# Patient Record
Sex: Male | Born: 1965
Health system: Southern US, Community
[De-identification: ages and names within clinical notes are randomized; demographics above are authoritative.]

## PROBLEM LIST (undated history)

## (undated) DIAGNOSIS — B029 Zoster without complications: Secondary | ICD-10-CM

## (undated) DIAGNOSIS — Z8619 Personal history of other infectious and parasitic diseases: Secondary | ICD-10-CM

## (undated) HISTORY — PX: HERNIA REPAIR: SHX51

## (undated) HISTORY — PX: KNEE ARTHROSCOPY: SHX127

## (undated) HISTORY — DX: Personal history of other infectious and parasitic diseases: Z86.19

## (undated) HISTORY — PX: SHOULDER ARTHROSCOPY: SHX128

---

## 2004-04-21 ENCOUNTER — Ambulatory Visit: Payer: Self-pay | Admitting: Orthopaedic Surgery

## 2004-04-28 ENCOUNTER — Encounter: Payer: Self-pay | Admitting: Orthopaedic Surgery

## 2004-05-21 ENCOUNTER — Encounter: Payer: Self-pay | Admitting: Orthopaedic Surgery

## 2005-09-29 ENCOUNTER — Emergency Department: Payer: Self-pay | Admitting: Unknown Physician Specialty

## 2006-01-18 ENCOUNTER — Emergency Department (HOSPITAL_COMMUNITY): Admission: EM | Admit: 2006-01-18 | Discharge: 2006-01-18 | Payer: Self-pay | Admitting: Emergency Medicine

## 2006-03-02 ENCOUNTER — Observation Stay: Payer: Self-pay | Admitting: Internal Medicine

## 2006-03-02 ENCOUNTER — Other Ambulatory Visit: Payer: Self-pay

## 2006-05-18 ENCOUNTER — Ambulatory Visit: Payer: Self-pay | Admitting: Family Medicine

## 2006-08-22 ENCOUNTER — Emergency Department: Payer: Self-pay | Admitting: Emergency Medicine

## 2006-08-22 ENCOUNTER — Other Ambulatory Visit: Payer: Self-pay

## 2006-09-05 ENCOUNTER — Ambulatory Visit: Payer: Self-pay | Admitting: Gastroenterology

## 2006-09-06 ENCOUNTER — Ambulatory Visit: Payer: Self-pay | Admitting: Gastroenterology

## 2006-11-23 ENCOUNTER — Emergency Department: Payer: Self-pay | Admitting: Unknown Physician Specialty

## 2006-11-23 ENCOUNTER — Other Ambulatory Visit: Payer: Self-pay

## 2006-12-21 ENCOUNTER — Ambulatory Visit: Payer: Self-pay | Admitting: Unknown Physician Specialty

## 2006-12-22 ENCOUNTER — Ambulatory Visit: Payer: Self-pay | Admitting: Cardiology

## 2007-01-03 ENCOUNTER — Encounter: Payer: Self-pay | Admitting: Orthopaedic Surgery

## 2007-01-20 ENCOUNTER — Encounter: Payer: Self-pay | Admitting: Orthopaedic Surgery

## 2007-08-13 IMAGING — CR DG CHEST 1V PORT
1 series · 1 of 1 positions shown · non-contrast
Comparison: none

REASON FOR EXAM: Chest pain rm 3
COMMENTS:

PROCEDURE:     DXR - DXR PORTABLE CHEST SINGLE VIEW  - March 02, 2006  [DATE]
RESULT:     AP view the chest shows a lung fields to be clear. No acute
changes of the heart, mediastinal or osseous structures are seen.
Monitoring electrodes are present.

[view not recorded]
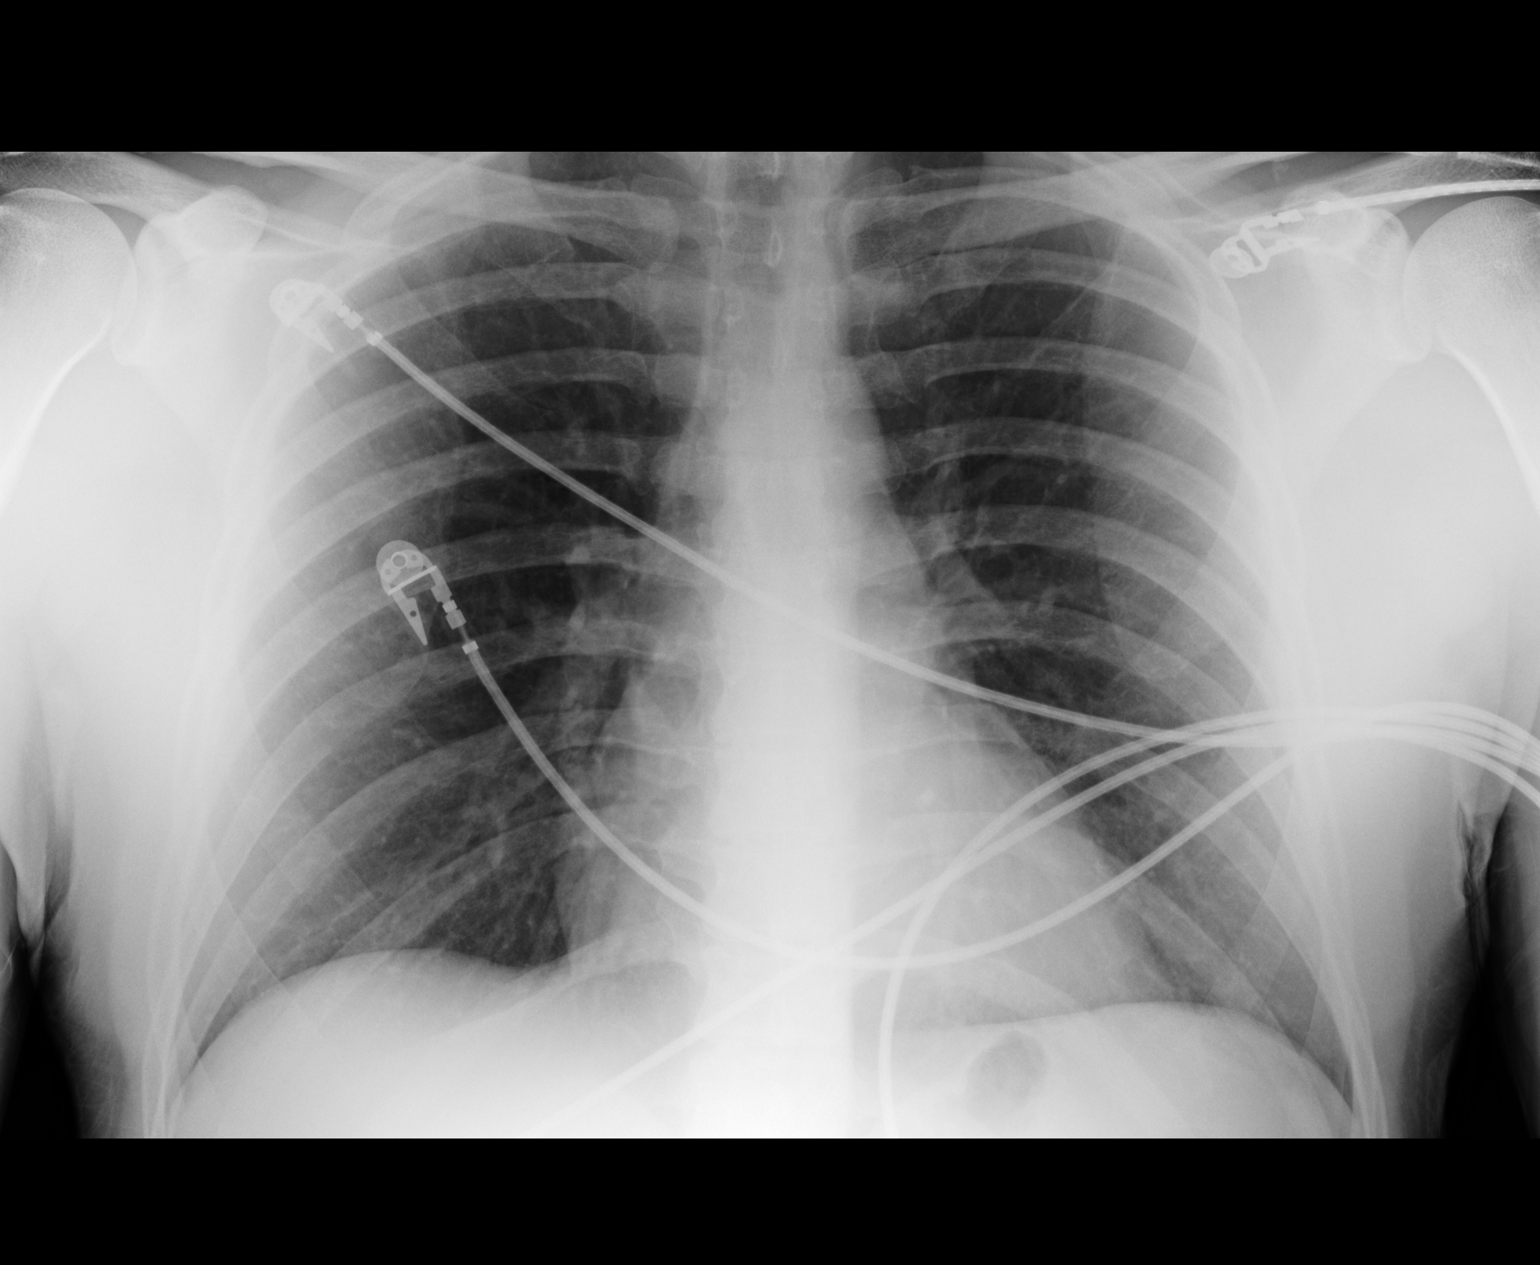

[1 of 1 positions shown; findings below may reference images not displayed]

IMPRESSION: 1.     No acute changes are identified.

## 2007-09-05 ENCOUNTER — Encounter: Payer: Self-pay | Admitting: Internal Medicine

## 2007-09-18 ENCOUNTER — Emergency Department: Payer: Self-pay | Admitting: Unknown Physician Specialty

## 2007-09-20 ENCOUNTER — Encounter: Payer: Self-pay | Admitting: Internal Medicine

## 2007-09-29 ENCOUNTER — Emergency Department: Payer: Self-pay | Admitting: Emergency Medicine

## 2007-10-20 ENCOUNTER — Encounter: Payer: Self-pay | Admitting: Internal Medicine

## 2007-11-20 ENCOUNTER — Encounter: Payer: Self-pay | Admitting: Internal Medicine

## 2007-12-20 ENCOUNTER — Encounter: Payer: Self-pay | Admitting: Internal Medicine

## 2008-02-02 IMAGING — CR DG CHEST 2V
1 series · 2 of 2 positions shown · non-contrast
Comparison: none

REASON FOR EXAM: cp
COMMENTS:

[Series 1: view not recorded · 0.17mm/px · 2 of 2 slices shown]
[im 1/2]
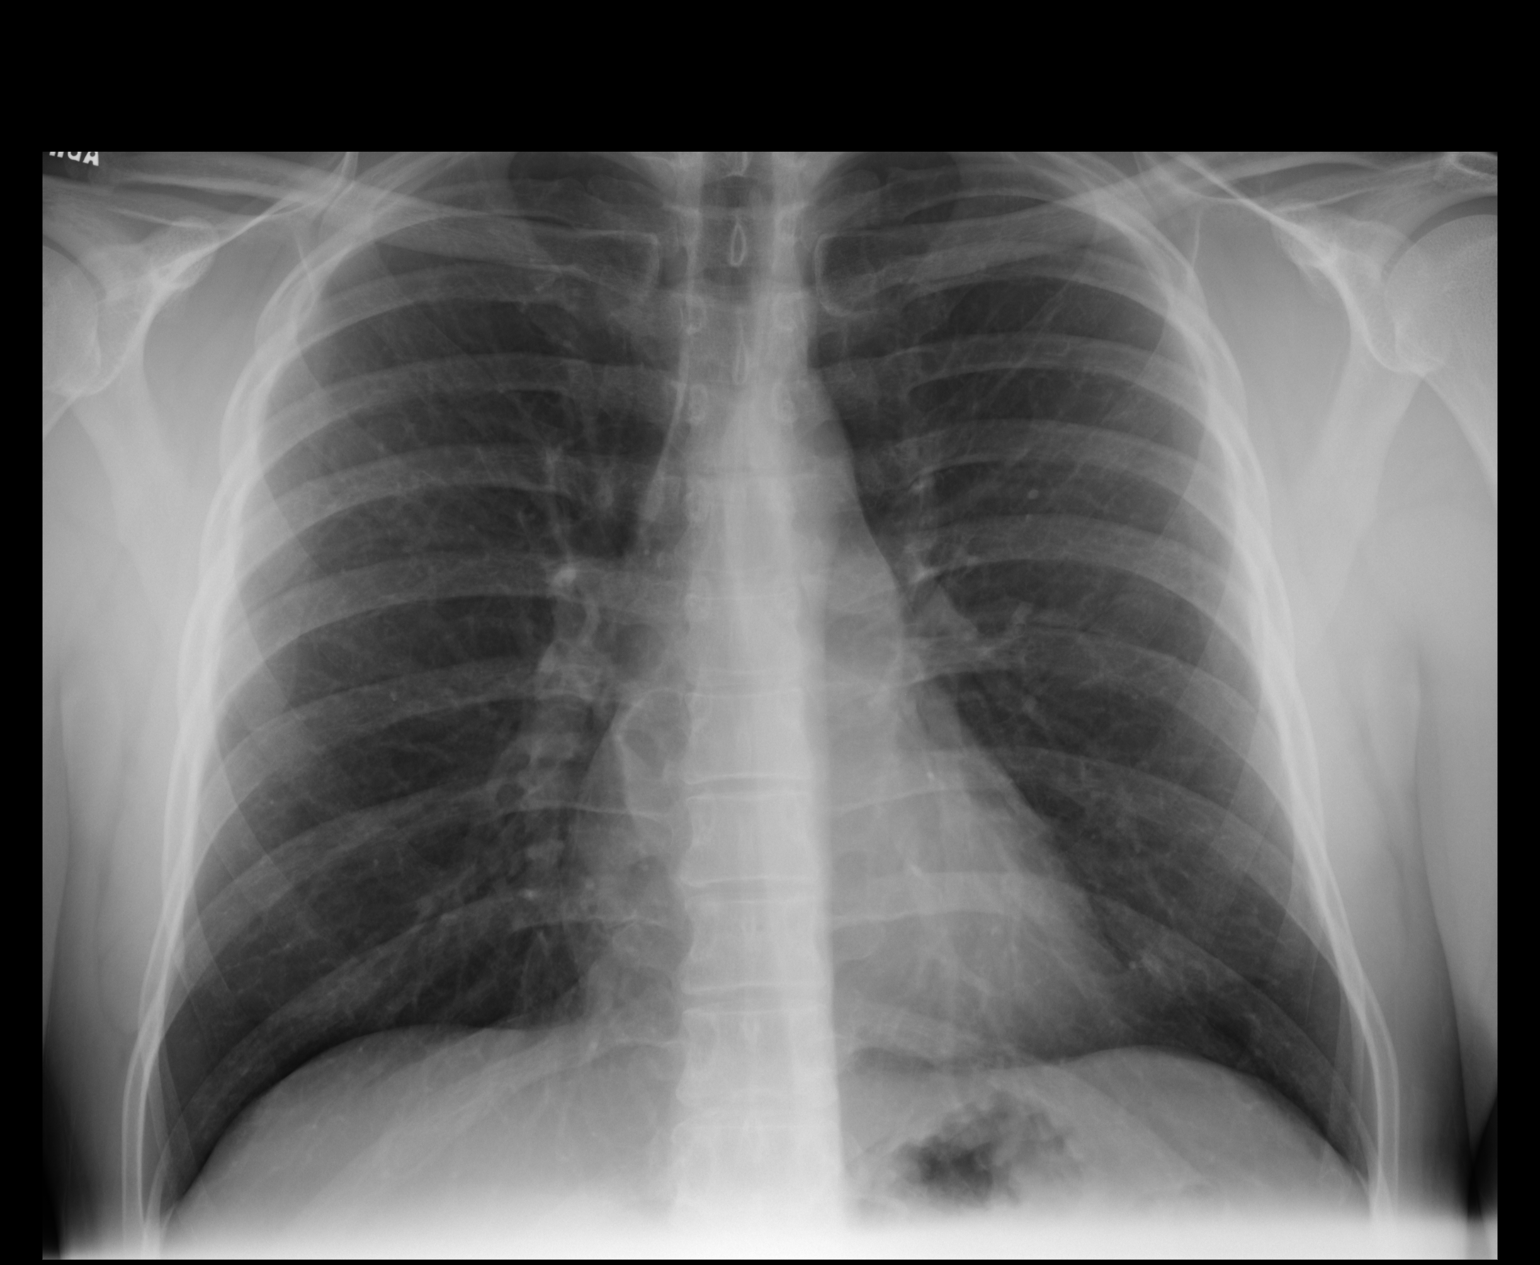
[im 2/2]
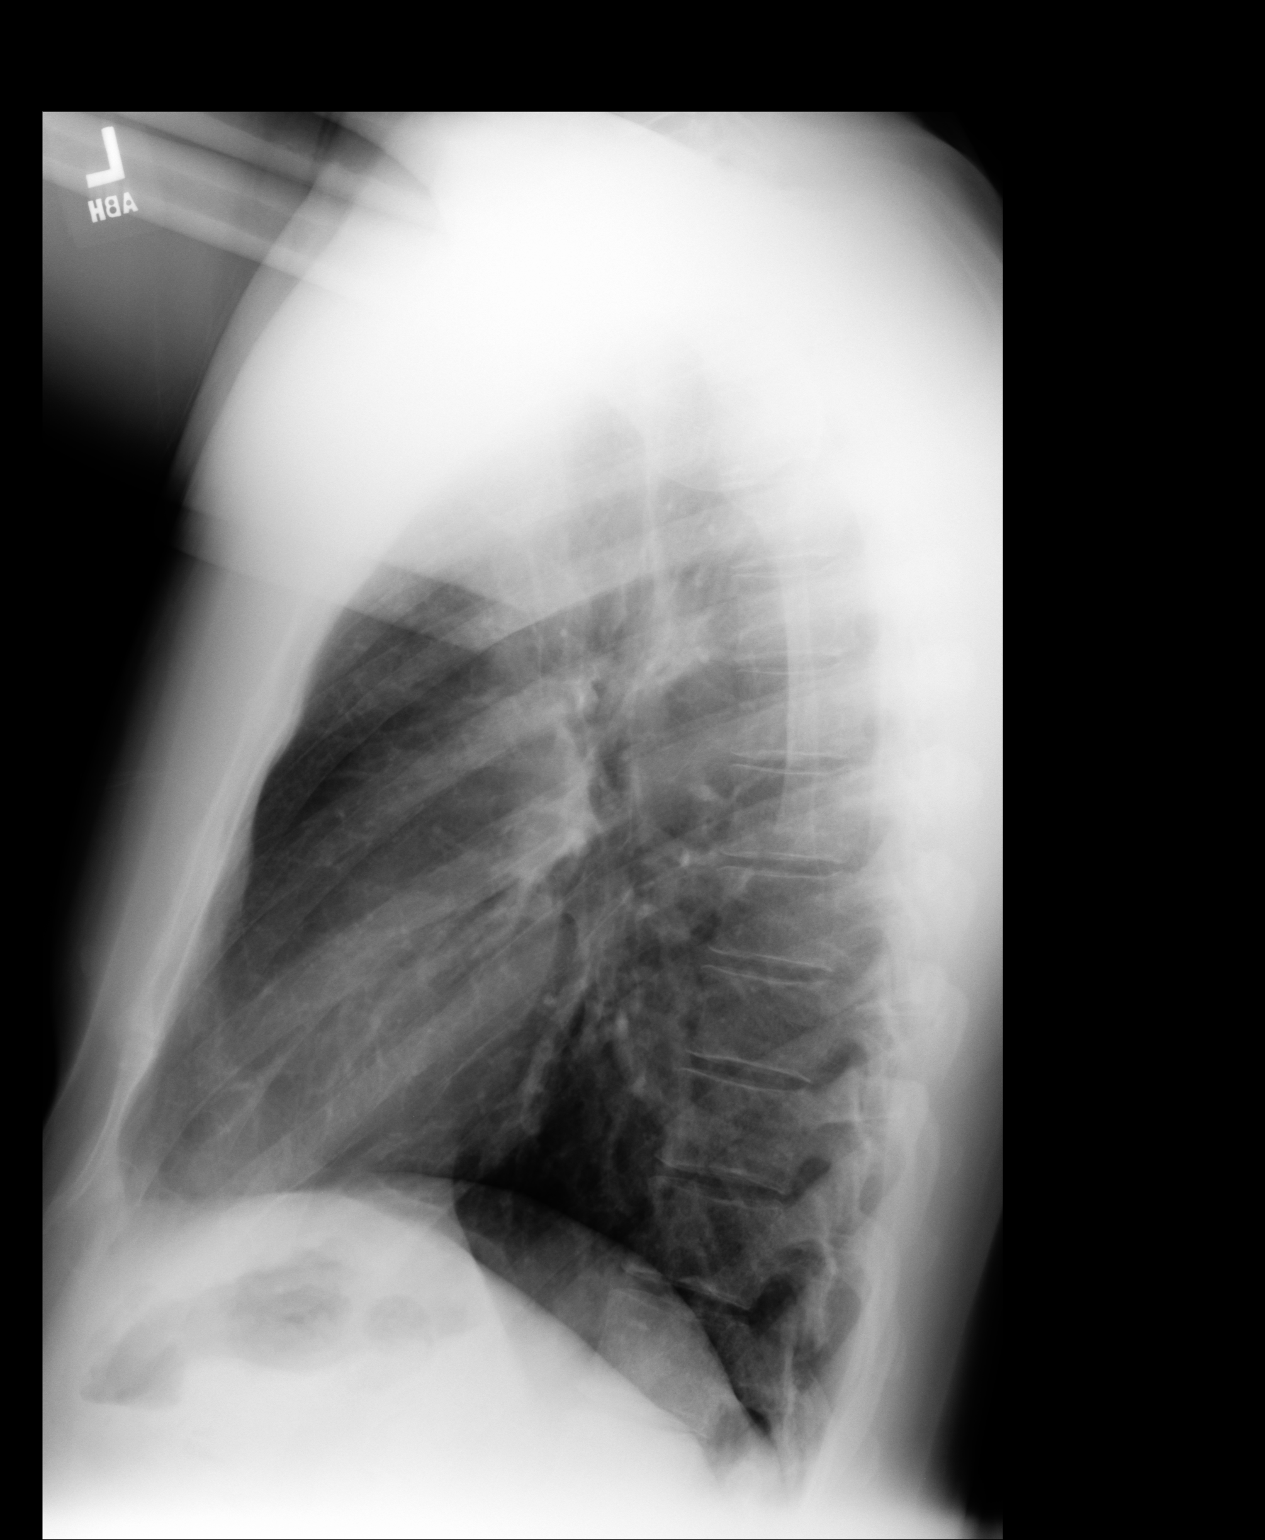

[2 of 2 positions shown; findings below may reference images not displayed]

PROCEDURE:     DXR - DXR CHEST PA (OR AP) AND LATERAL  - August 22, 2006  [DATE]

RESULT:     Comparison is made to the study dated 03/02/2006. The heart and
pulmonary vessels are normal. The lungs are clear. The bony and mediastinal
structures are unremarkable. There is mild hyperinflation. This can be seen
in reactive airway disease.
IMPRESSION: 1. Mild hyperinflation. Clinical correlation is recommended.
2. No acute cardiopulmonary disease.

## 2008-02-16 IMAGING — US ABDOMEN ULTRASOUND
1 series · 17 of 25 positions shown · non-contrast
Comparison: none

REASON FOR EXAM: epigastric pain  seen in ED/[HOSPITAL] 08-22-06
COMMENTS:

PROCEDURE:     US  - US ABDOMEN GENERAL SURVEY  - September 05, 2006  [DATE]
RESULT:     The liver, spleen, pancreas and abdominal aorta are normal
appearance. No gallstones are seen. There is no thickening of the
gallbladder wall. The kidneys show no hydronephrosis. There is no ascites.

[Series 1: abdomen ultrasound · 17 of 60 slices shown]
[im 1/60]
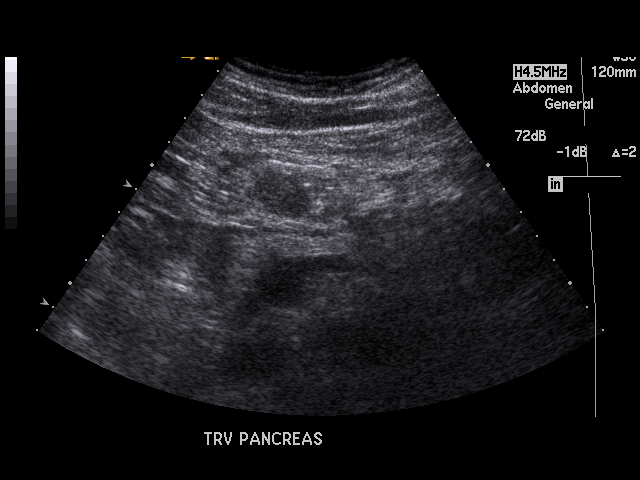
[im 5/60]
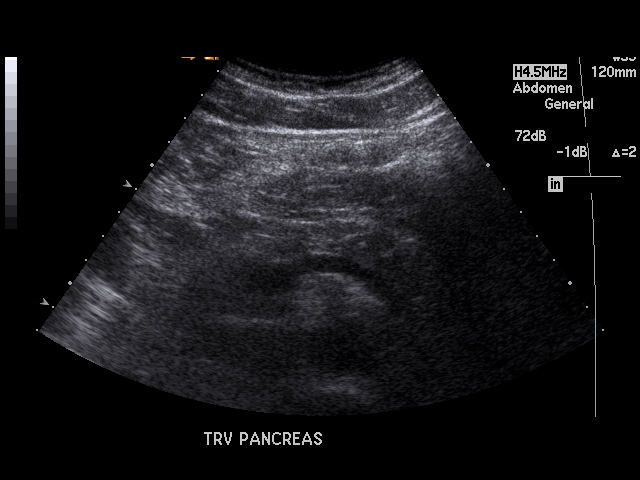
[im 8/60]
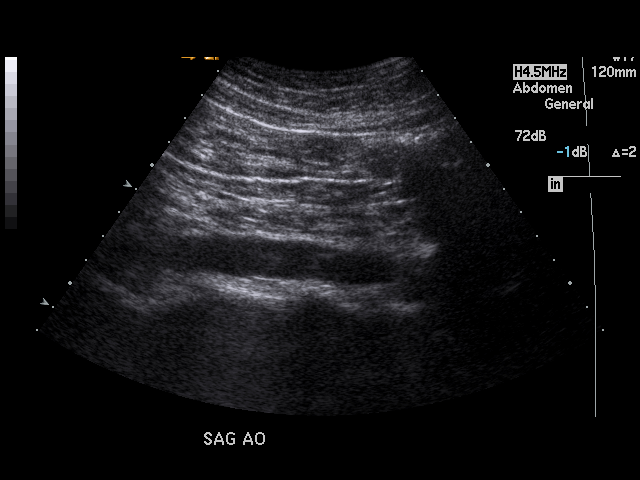
[im 13/60]
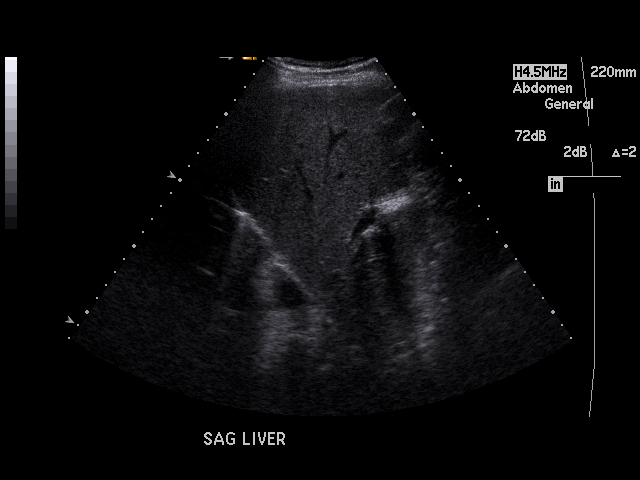
[im 15/60]
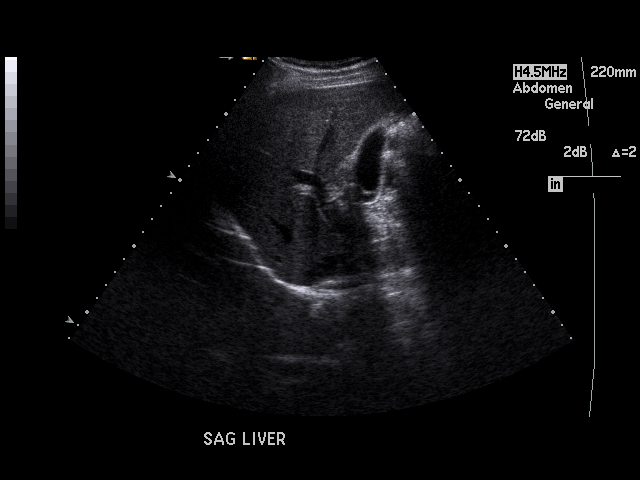
[im 20/60]
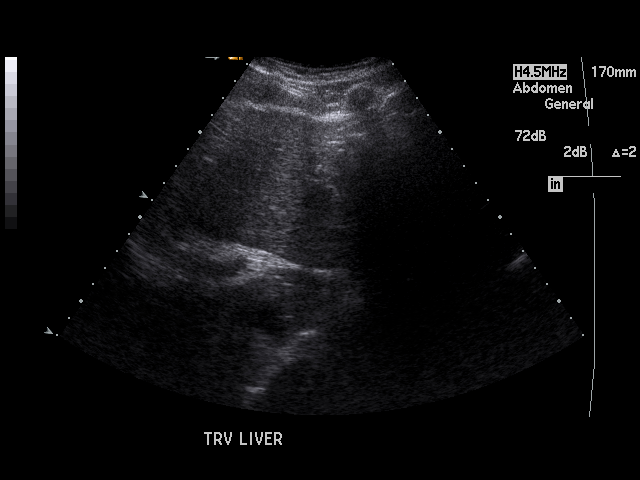
[im 23/60]
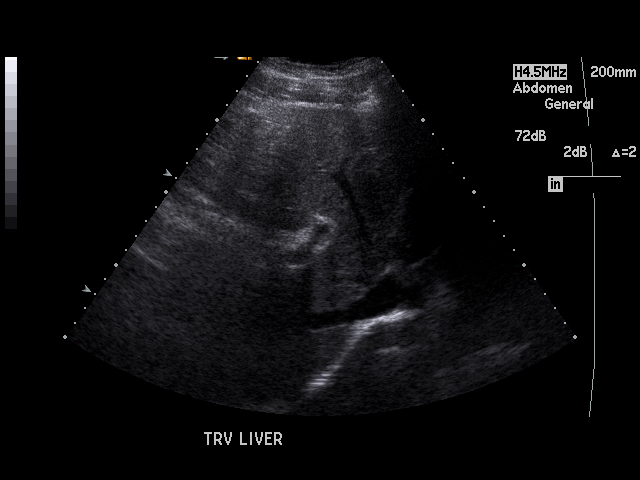
[im 28/60]
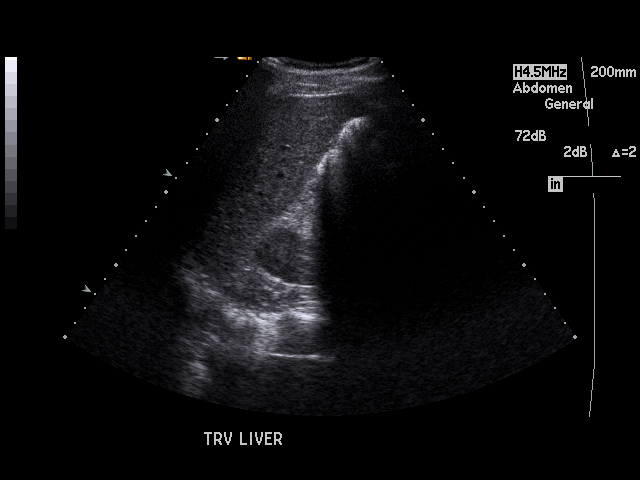
[im 30/60]
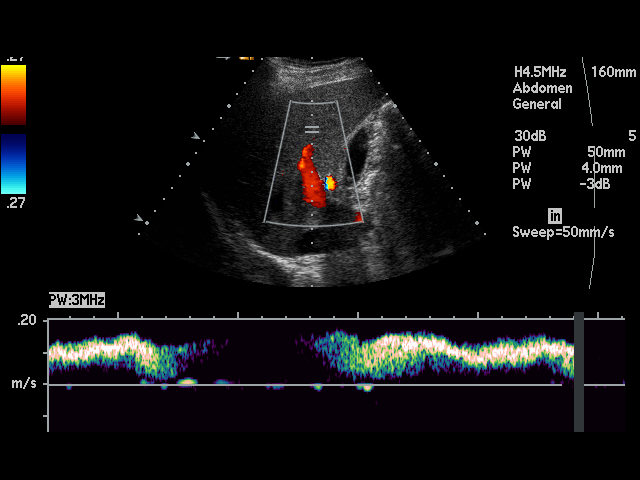
[im 32/60]
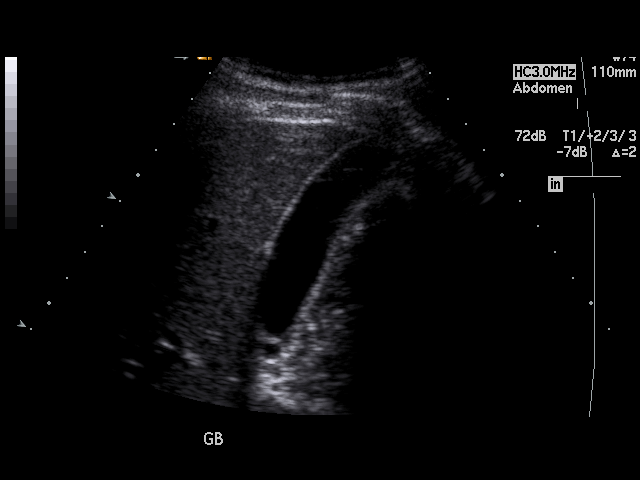
[im 37/60]
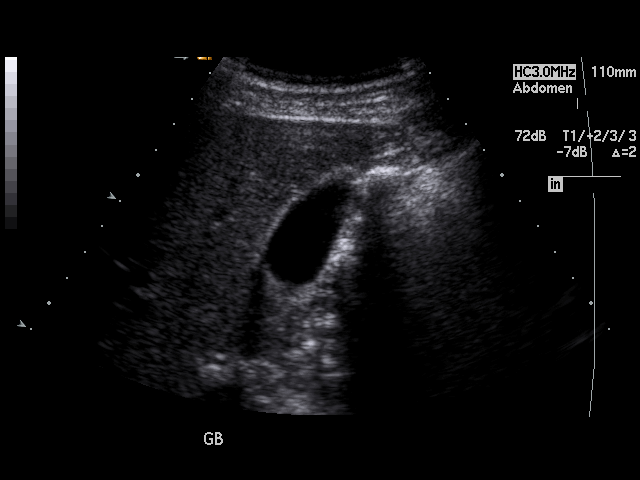
[im 40/60]
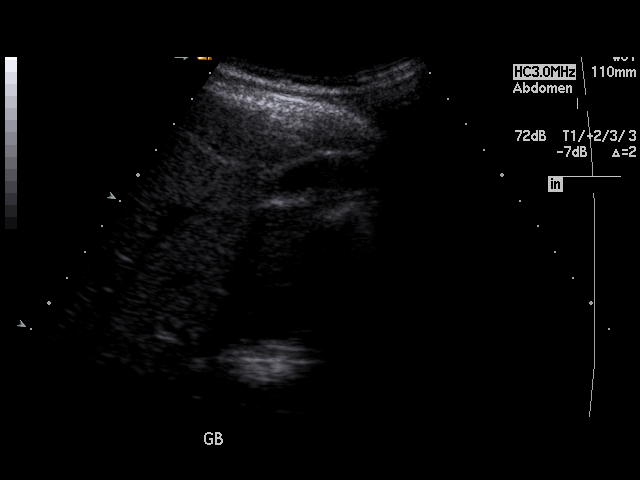
[im 45/60]
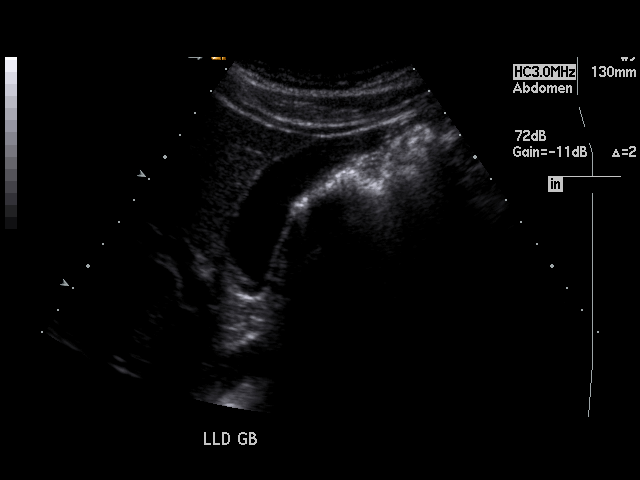
[im 47/60]
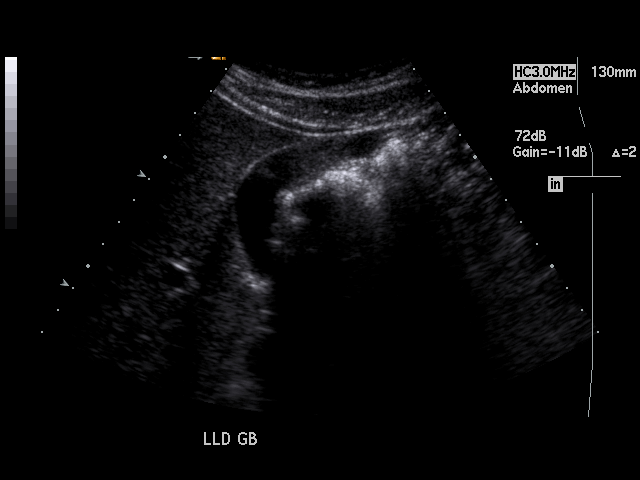
[im 52/60]
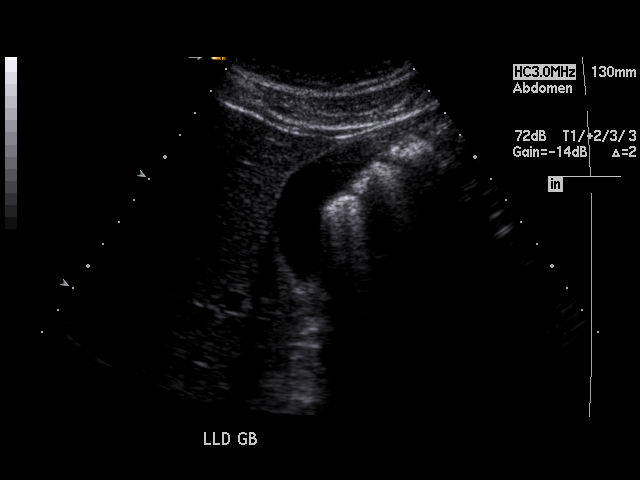
[im 55/60]
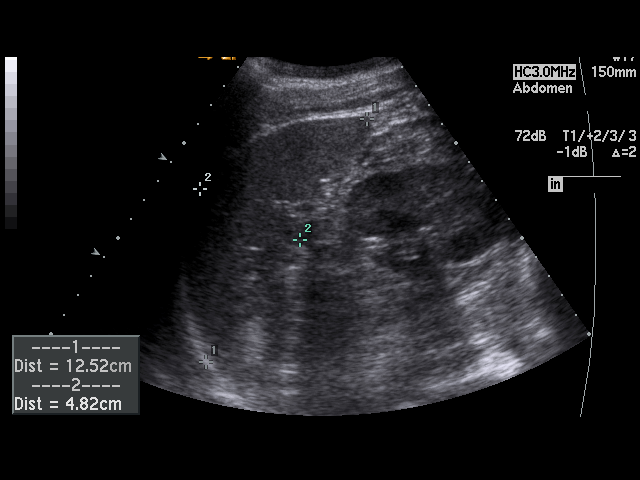
[im 60/60]
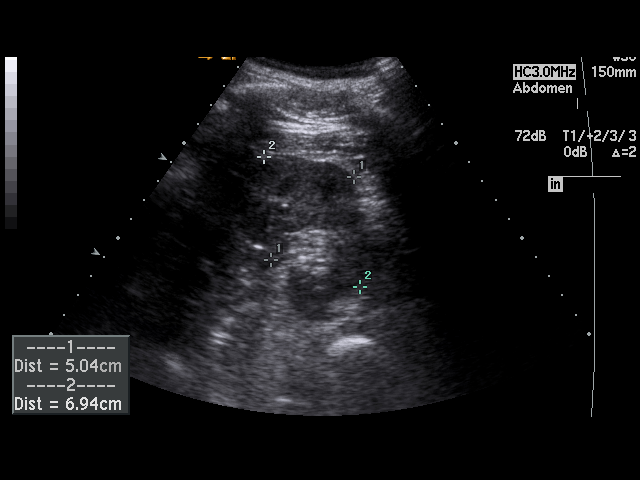

[17 of 25 positions shown; findings below may reference images not displayed]

IMPRESSION: 1. No significant abnormalities are noted.
2. No gallstones are seen.
3. The common bile duct measures 3.2 mm in diameter which is within normal
limits.

## 2009-02-28 IMAGING — CT CT ABD-PELV W/O CM
1 of 2 series · 15 of 32 positions shown, 19 images · non-contrast
Comparison: none

REASON FOR EXAM: (1) left llq pain; (2) llq pain
COMMENTS:

[Series 2: stone · axial · 0.68mm/px · z∈[-128,+268]mm · 15 of 149 slices shown, 19 images]
[im 11/149  soft-tissue]
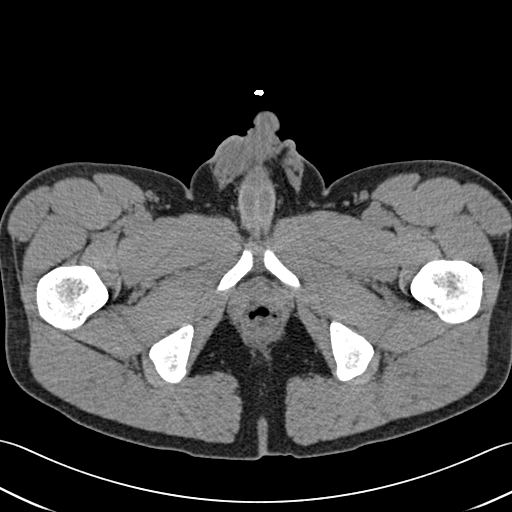
[im 11/149  bone]
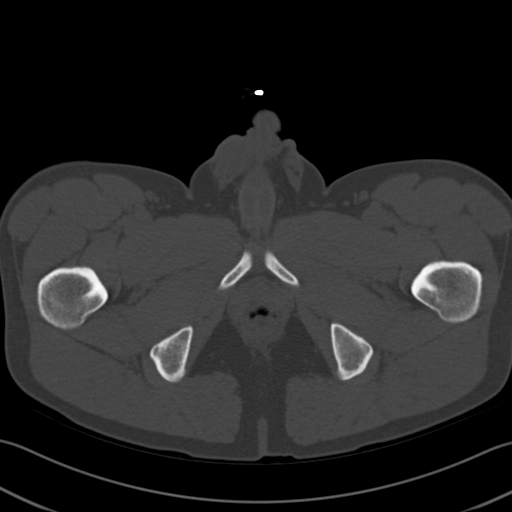
[im 22/149  soft-tissue]
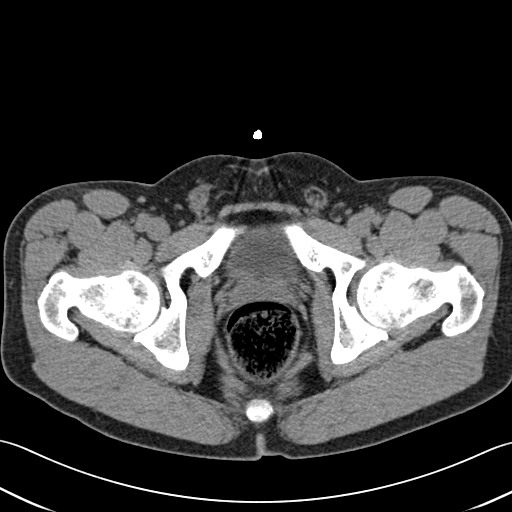
[im 32/149  soft-tissue]
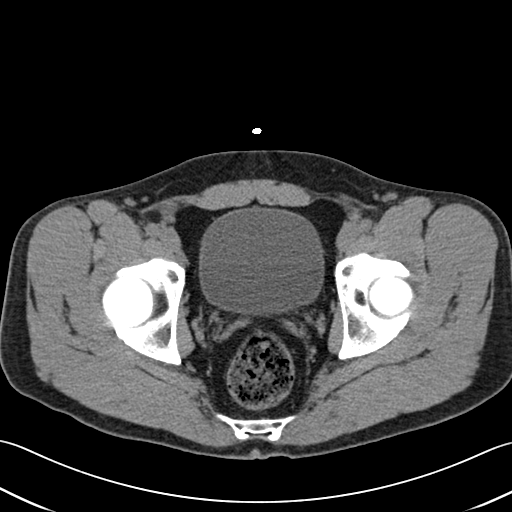
[im 43/149  soft-tissue]
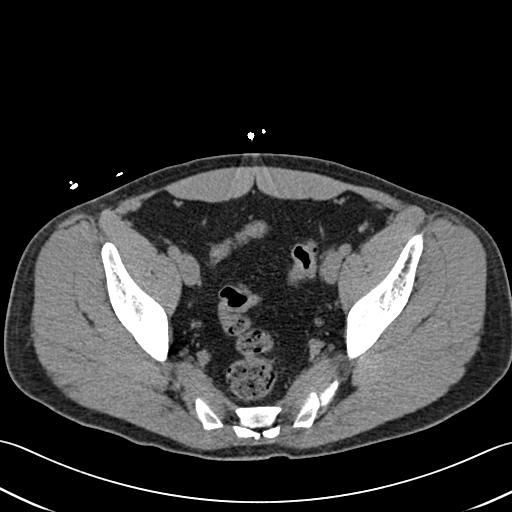
[im 53/149  soft-tissue]
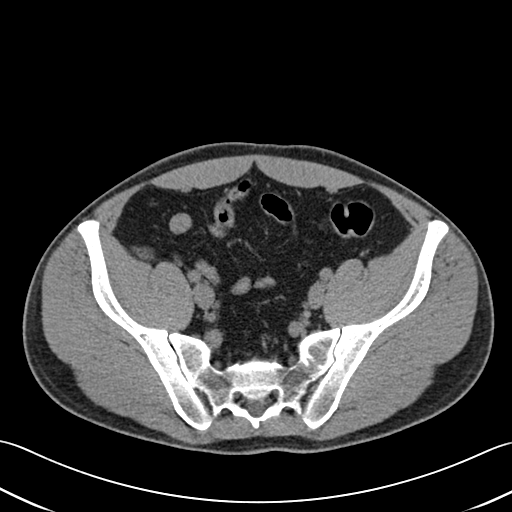
[im 64/149  soft-tissue]
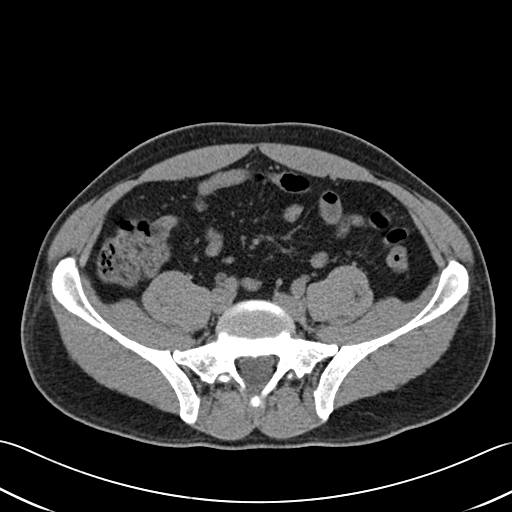
[im 75/149  soft-tissue]
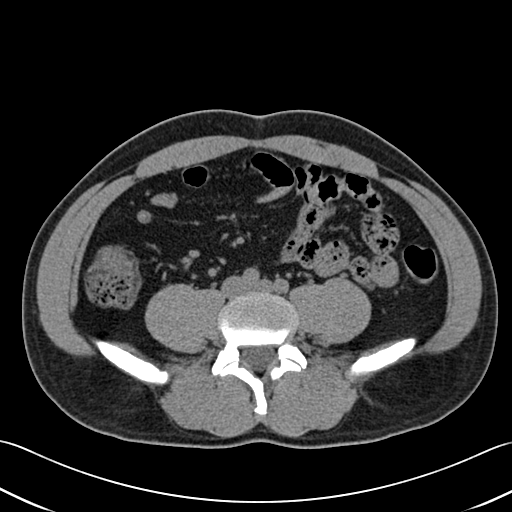
[im 85/149  soft-tissue]
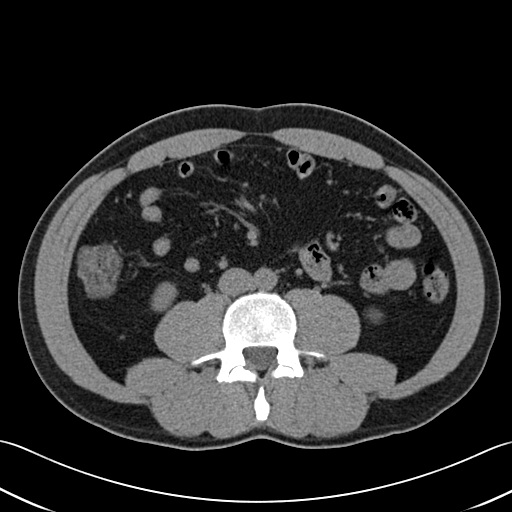
[im 96/149  soft-tissue]
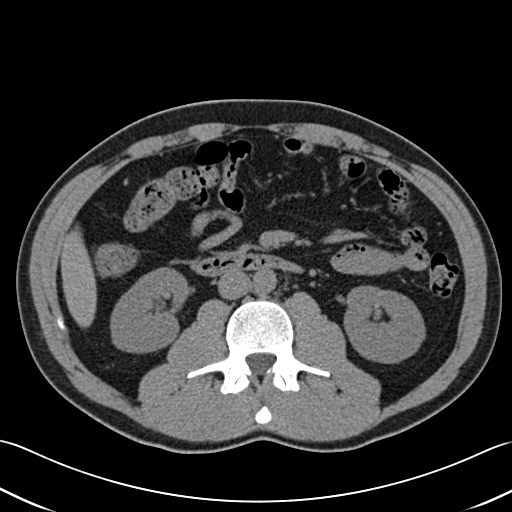
[im 96/149  bone]
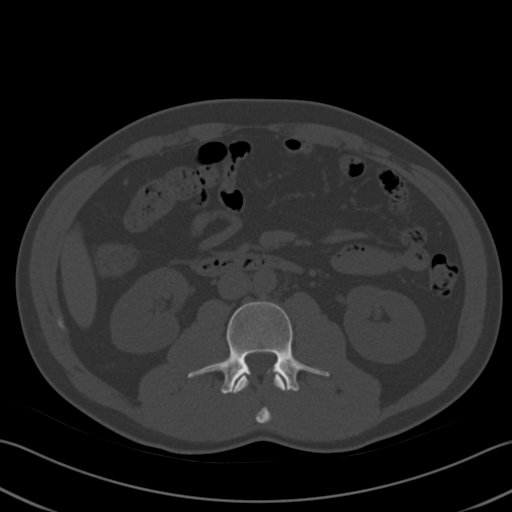
[im 106/149  soft-tissue]
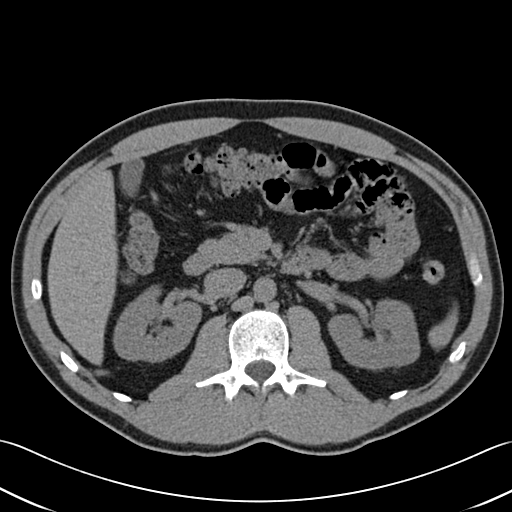
[im 117/149  soft-tissue]
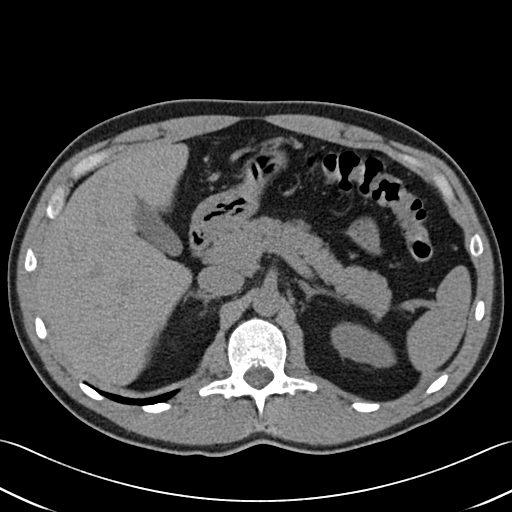
[im 127/149  soft-tissue]
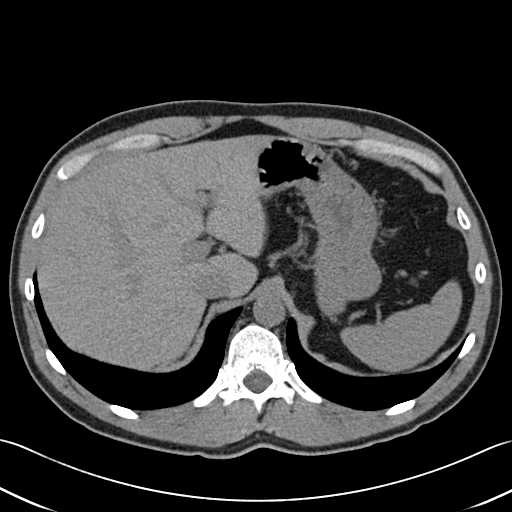
[im 127/149  lung]
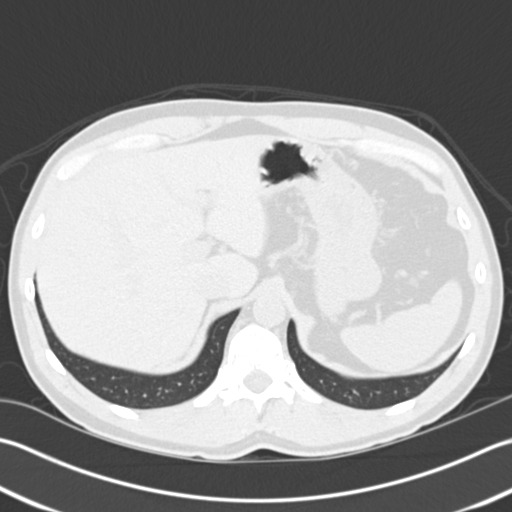
[im 133/149  lung]
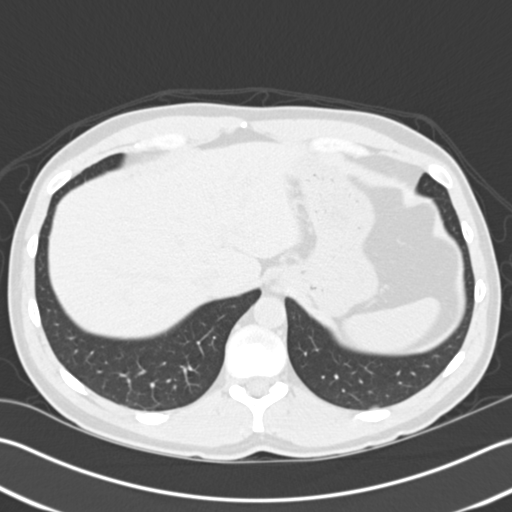
[im 138/149  soft-tissue]
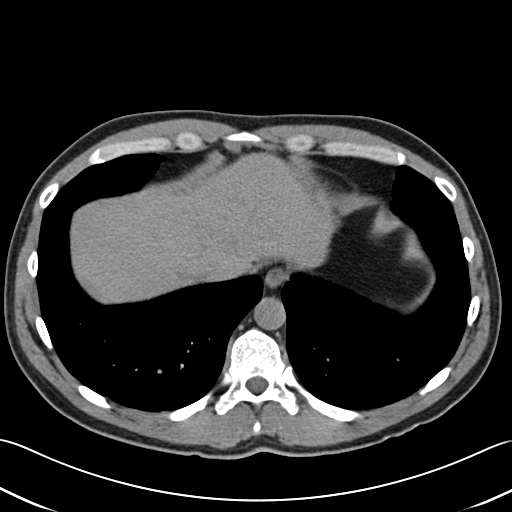
[im 138/149  lung]
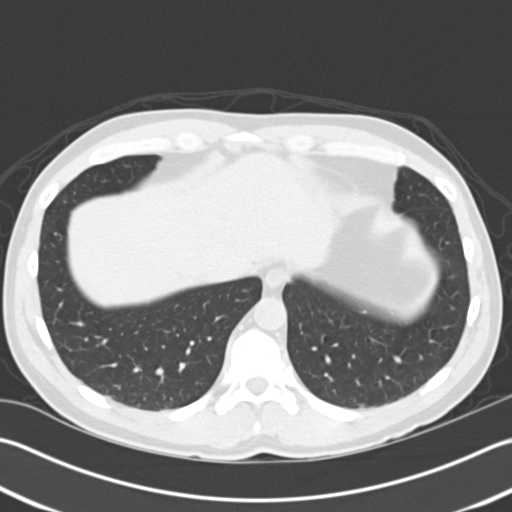
[im 143/149  lung]
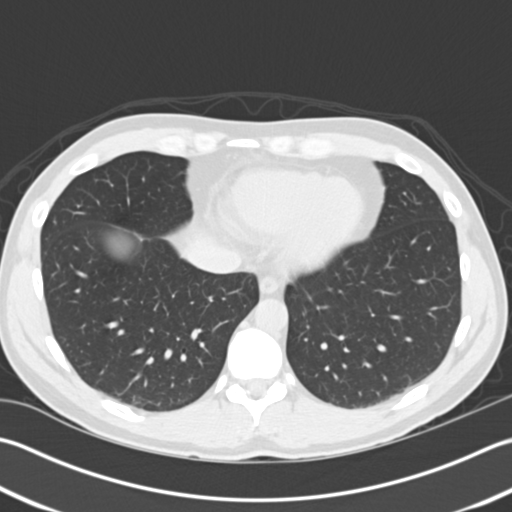

[15 of 32 positions shown; findings below may reference images not displayed]

PROCEDURE:     CT  - CT ABDOMEN AND PELVIS W[DATE] [DATE]

RESULT:     The study was performed without IV contrast. The patient is
complaining of LEFT lower quadrant discomfort.

The kidneys exhibit normal density and contour. There is no evidence of
obstruction. No perinephric edema is identified. The ureters are normal in
course and caliber. The partially distended urinary bladder is normal in
appearance. There is a phlebolith in the LEFT aspect of the pelvis. The
prostate gland is normal in appearance. The sigmoid colon and rectum are
mildly distended with stool and gas. I do not see objective evidence of
acute diverticulitis. No inguinal hernia is identified. There is no evidence
of ascites.

Elsewhere, the unopacified loops of small and large bowel are normal in
appearance. The liver, gallbladder, pancreas, nondistended stomach, spleen,
and adrenal glands are normal in appearance. There is a small accessory
spleen measuring approximately 12 mm in diameter. The caliber of the
abdominal aorta is normal. There is no evidence of a periumbilical hernia.
The lumbar vertebral bodies are preserved in height. The lung bases are
clear.
IMPRESSION: 1. I see no evidence of urinary tract obstruction or inflammation or stones.
2. There is no evidence of acute bowel abnormality. The sigmoid colon
proximally is incompletely distended and there are diverticula present, but
I do not see pericolonic inflammatory change.
3. I see no acute abnormality elsewhere within the abdomen or pelvis on this
noncontrast study. Follow-up contrast-enhanced scanning is available upon
request.

This report was called to the [HOSPITAL] the conclusion of the
study.

## 2010-07-20 ENCOUNTER — Ambulatory Visit
Admission: RE | Admit: 2010-07-20 | Discharge: 2010-07-20 | Payer: Self-pay | Source: Home / Self Care | Attending: Internal Medicine | Admitting: Internal Medicine

## 2012-01-06 ENCOUNTER — Encounter: Payer: Self-pay | Admitting: Internal Medicine

## 2012-01-06 ENCOUNTER — Ambulatory Visit (INDEPENDENT_AMBULATORY_CARE_PROVIDER_SITE_OTHER): Payer: 59 | Admitting: Internal Medicine

## 2012-01-06 VITALS — BP 110/70 | HR 77 | Temp 98.7°F | Ht 69.0 in | Wt 178.0 lb

## 2012-01-06 DIAGNOSIS — E161 Other hypoglycemia: Secondary | ICD-10-CM

## 2012-01-06 DIAGNOSIS — E162 Hypoglycemia, unspecified: Secondary | ICD-10-CM

## 2012-01-06 DIAGNOSIS — T6701XA Heatstroke and sunstroke, initial encounter: Secondary | ICD-10-CM

## 2012-01-06 LAB — CK: Total CK: 200 U/L (ref 7–232)

## 2012-01-06 LAB — GLUCOSE, POCT (MANUAL RESULT ENTRY): POC Glucose: 93 mg/dl (ref 70–99)

## 2012-01-06 NOTE — Patient Instructions (Addendum)
Your symptoms are all suggestive of heat exhaustion/heat stroke, but I am also ruling out diabetes and  Thyroid problems .  I advise avoiding running when the ambient temperature is > 85 and to always use an electrolyte replacement fluid that contains sugar  .  Remember to combine protein with any meal or snack    I advise your  using OTC prilosec or prevacid (omeprazole 20 mg daily in the morning  ) while using daily Alleve, to protect your stomach from gastritis

## 2012-01-06 NOTE — Progress Notes (Signed)
Patient ID: Jeffery Mcmillan, male   DOB: 09/06/1965, 46 y.o.   MRN: [id number]  Patient Active Problem List  Diagnosis  . Reactive hypoglycemia    Subjective:  CC:   Chief Complaint  Patient presents with  . Follow-up    HPI:   Jeffery Mcmillan a 46 y.o. male who presents  Past Medical History  Diagnosis Date  . History of chicken pox     History reviewed. No pertinent past surgical history.       The following portions of the patient's history were reviewed and updated as appropriate: Allergies, current medications, and problem list.    Review of Systems:   12 Pt  review of systems was negative except those addressed in the HPI,     History   Social History  . Marital Status: Married    Spouse Name: N/A    Number of Children: N/A  . Years of Education: 16   Occupational History  . Police Officer [employer]   Social History Main Topics  . Smoking status: Never Smoker   . Smokeless tobacco: Never Used  . Alcohol Use: No  . Drug Use: Yes  . Sexually Active: Not on file   Other Topics Concern  . Not on file   Social History Narrative   Regular exercise-yesCaffeine Use-yes    Objective:  BP 110/70  Pulse 77  Temp 98.7 F (37.1 C) (Oral)  Ht 5\' 9"  (1.753 m)  Wt 178 lb (80.74 kg)  BMI 26.29 kg/m2  SpO2 98%  General appearance: alert, cooperative and appears stated age Neck: no adenopathy, no carotid bruit, supple, symmetrical, trachea midline and thyroid not enlarged, symmetric, no tenderness/mass/nodules Back: symmetric, no curvature. ROM normal. No CVA tenderness. Lungs: clear to auscultation bilaterally Heart: regular rate and rhythm, S1, S2 normal, no murmur, click, rub or gallop Abdomen: soft, non-tender; bowel sounds normal; no masses,  no organomegaly Pulses: 2+ and symmetric Skin: Skin color, texture, turgor normal. No rashes or lesions Lymph nodes: Cervical, supraclavicular, and axillary nodes normal. MSK: normal muscle  tone, full ROM, no wasting NEURO: grossly nonfocal  Assessment and Plan:  Reactive hypoglycemia His recent episodes have occurred in the setting of extreme heat exhaustion and is electively. He does have a family history of diabetes but no personal history. Random glucose today isn't within normal limits but his hemoglobin A1c is 6.0 suggesting that he may have adult onset diet controlled diabetes. Have recommended that he modify his diet to increase the protein in limit the complex carbohydrates and reduce his workouts will considerably. He has been using water to hydrate during his workouts and sewed it electrolyte drink which I recommended that he change to. He continues to have episodes of hypoglycemia we will refer her to Dr. Zachery Dakins endocrinology.   Updated Medication List No outpatient encounter prescriptions on file as of 01/06/2012.     Orders Placed This Encounter  Procedures  . COMPLETE METABOLIC PANEL WITH GFR  . TSH  . CK  . Hemoglobin A1c  . Magnesium  . POCT glucose (manual entry)    No Follow-up on file.

## 2012-01-07 ENCOUNTER — Telehealth: Payer: Self-pay | Admitting: *Deleted

## 2012-01-07 ENCOUNTER — Telehealth: Payer: Self-pay | Admitting: Internal Medicine

## 2012-01-07 LAB — COMPLETE METABOLIC PANEL WITH GFR
ALT: 18 U/L (ref 0–53)
AST: 22 U/L (ref 0–37)
Calcium: 9.3 mg/dL (ref 8.4–10.5)
Chloride: 103 mEq/L (ref 96–112)
GFR, Est Non African American: 87 mL/min
Total Bilirubin: 0.5 mg/dL (ref 0.3–1.2)

## 2012-01-07 NOTE — Telephone Encounter (Signed)
Pt called requesting results of lab work from yesterday-he was having an issue with MyChart sign up.

## 2012-01-07 NOTE — Telephone Encounter (Signed)
All of his labs were normal, but his hgba1c was not as low as I would expect

## 2012-01-08 ENCOUNTER — Encounter: Payer: Self-pay | Admitting: Internal Medicine

## 2012-01-08 DIAGNOSIS — R7303 Prediabetes: Secondary | ICD-10-CM | POA: Insufficient documentation

## 2012-01-08 NOTE — Assessment & Plan Note (Signed)
His recent episodes have occurred in the setting of extreme heat exhaustion and is electively. He does have a family history of diabetes but no personal history. Random glucose today isn't within normal limits but his hemoglobin A1c is 6.0 suggesting that he may have adult onset diet controlled diabetes. Have recommended that he modify his diet to increase the protein in limit the complex carbohydrates and reduce his workouts will considerably. He has been using water to hydrate during his workouts and sewed it electrolyte drink which I recommended that he change to. He continues to have episodes of hypoglycemia we will refer her to Dr. Zachery Dakins endocrinology.

## 2012-01-10 ENCOUNTER — Telehealth: Payer: Self-pay | Admitting: Internal Medicine

## 2012-01-10 NOTE — Telephone Encounter (Signed)
Patient notified

## 2012-01-10 NOTE — Telephone Encounter (Signed)
Patient notified of results.

## 2012-01-10 NOTE — Telephone Encounter (Signed)
Patient has been notified

## 2012-01-10 NOTE — Telephone Encounter (Signed)
Please call patient with his results the physician tried to speak with him on Friday and his phone cut off.

## 2012-06-02 ENCOUNTER — Encounter: Payer: Self-pay | Admitting: Adult Health

## 2012-06-02 ENCOUNTER — Ambulatory Visit (INDEPENDENT_AMBULATORY_CARE_PROVIDER_SITE_OTHER): Payer: 59 | Admitting: Adult Health

## 2012-06-02 VITALS — BP 139/89 | HR 76 | Temp 98.7°F | Wt 181.0 lb

## 2012-06-02 DIAGNOSIS — J019 Acute sinusitis, unspecified: Secondary | ICD-10-CM

## 2012-06-02 DIAGNOSIS — Z23 Encounter for immunization: Secondary | ICD-10-CM

## 2012-06-02 MED ORDER — AZITHROMYCIN 250 MG PO TABS: ORAL_TABLET | ORAL | Status: DC

## 2012-06-02 MED ORDER — FLUTICASONE PROPIONATE 50 MCG/ACT NA SUSP: 2.0000 | Freq: Every day | NASAL | Status: DC

## 2012-06-02 NOTE — Patient Instructions (Addendum)
  Please call if you do not feel any improvement within the next 2-3 days.  We will give you your flu shot today before you leave.  Start your antibiotic today.

## 2012-06-02 NOTE — Assessment & Plan Note (Signed)
Symptoms unresolved with OTC medications since 05/21/12. Will start Azithromycin and Flonase.

## 2012-06-02 NOTE — Progress Notes (Signed)
  Subjective:    Patient ID: DE JAWORSKI, male    DOB: [birth year], 46 y.o.   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 The sore throat has subsided but he reports congestion in his head and now in his chest. Positive for productive coughing, fullness in ears. Denies fever, sob, chest pain. Pt  has tried OTC products (tylenol cold/flu) but "just can't seem to get rid of this". He has not had flu vaccine this year. We will give this to him today.  Review of Systems  Constitutional: Negative for fever, chills and appetite change.  HENT: Positive for congestion, rhinorrhea and postnasal drip.        Ear fullness and "popping"  Respiratory: Positive for cough and wheezing. Negative for shortness of breath.   Cardiovascular: Negative for chest pain.  Gastrointestinal: Negative for nausea, vomiting and diarrhea.  Genitourinary: Negative for dysuria.  Musculoskeletal: Negative.   Skin: Negative for rash.  Neurological: Negative for dizziness, weakness, light-headedness and headaches.  Psychiatric/Behavioral: Negative.        Objective:   Physical Exam  Constitutional: He is oriented to person, place, and time. He appears well-developed and well-nourished. No distress.  HENT:  Head: Normocephalic.       Right ear with mild bulging. Pharynx with cobblestone appearance. Nasal mucosa erythematous.  Eyes: Right eye exhibits no discharge. Left eye exhibits discharge. No scleral icterus.  Neck: Normal range of motion. No tracheal deviation present.  Cardiovascular: Normal rate, regular rhythm and normal heart sounds.   No murmur heard. Pulmonary/Chest: Effort normal and breath sounds normal. No respiratory distress. He has no wheezes. He has no rales.  Abdominal: Bowel sounds are normal.  Musculoskeletal: Normal range of motion.  Lymphadenopathy:    He has no cervical adenopathy.  Neurological: He is  alert and oriented to person, place, and time.  Skin: Skin is warm and dry. No rash noted.  Psychiatric: He has a normal mood and affect. His behavior is normal. Judgment and thought content normal.          Assessment & Plan:

## 2012-06-09 ENCOUNTER — Encounter: Payer: Self-pay | Admitting: Adult Health

## 2012-06-09 ENCOUNTER — Ambulatory Visit (INDEPENDENT_AMBULATORY_CARE_PROVIDER_SITE_OTHER): Payer: 59 | Admitting: Adult Health

## 2012-06-09 VITALS — BP 130/84 | HR 85 | Temp 98.2°F | Ht 70.0 in | Wt 179.0 lb

## 2012-06-09 DIAGNOSIS — J329 Chronic sinusitis, unspecified: Secondary | ICD-10-CM

## 2012-06-09 MED ORDER — LEVOFLOXACIN 500 MG PO TABS: 500.0000 mg | ORAL_TABLET | Freq: Every day | ORAL | Status: DC

## 2012-06-09 MED ORDER — PREDNISONE 10 MG PO TABS: ORAL_TABLET | ORAL | Status: DC

## 2012-06-09 NOTE — Progress Notes (Signed)
  Subjective:    Patient ID: Jeffery Mcmillan, male    DOB: 1965/07/28, 46 y.o.   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 He is s/p azithromycin and presents to clinic today with report that symptoms appear to be returning. He is coughing up clear sputum, feels sinus pressure, occasional HA. He reports that he tried the flonase that was prescribed but only used it once or twice.   Current Outpatient Prescriptions on File Prior to Visit  Medication Sig Dispense Refill  . fluticasone (FLONASE) 50 MCG/ACT nasal spray Place 2 sprays into the nose daily.  16 g  6     Review of Systems  Constitutional: Negative for fever, chills and fatigue.  HENT: Positive for congestion, rhinorrhea and postnasal drip.   Eyes: Negative.   Respiratory: Positive for cough. Negative for shortness of breath and wheezing.   Cardiovascular: Negative.   Gastrointestinal: Negative.   Genitourinary: Negative.   Neurological: Negative for dizziness and weakness.  Psychiatric/Behavioral: Negative for confusion and agitation. The patient is not nervous/anxious.          Objective:   Physical Exam  Constitutional: He is oriented to person, place, and time. He appears well-developed and well-nourished. No distress.  HENT:  Head: Normocephalic.  Neck: Normal range of motion. No tracheal deviation present.  Cardiovascular: Normal rate, regular rhythm and normal heart sounds.   No murmur heard. Pulmonary/Chest: Effort normal and breath sounds normal. He has no wheezes. He has no rales.  Abdominal: Soft. Bowel sounds are normal.  Musculoskeletal: Normal range of motion.  Lymphadenopathy:    He has no cervical adenopathy.  Neurological: He is alert and oriented to person, place, and time.  Skin: Skin is warm and dry. No rash noted.  Psychiatric: He has a normal mood and affect. His behavior is normal. Judgment and thought content normal.       BP 130/84  Pulse 85  Temp 98.2 F (36.8 C) (Oral)  Ht 5\' 10"  (1.778 m)  Wt 179 lb (81.194 kg)  BMI 25.68 kg/m2  SpO2 96%   Assessment & Plan:

## 2012-06-09 NOTE — Assessment & Plan Note (Signed)
Suspect viral sinusitis. Patient is afebrile and his secretions are clear. Will start prednisone taper. May use benadryl and sudafed PE prn. Gave patient prescription for antibiotic to fill only if begins to have productive cough of greenish sputum. Continue flonase daily.

## 2012-06-09 NOTE — Patient Instructions (Addendum)
   Start prednisone taper (6 tablets on day 1, then 5 tablets on day 2, then 4 tablets on day 3, then 3 tablets on day 4, then 2 tablets on day 5, then 1 tablet on day 6.  You can try benadryl and sudafed PE. Continue flonase.  If you start to cough up greenish sputum, develop a fever greater than 100 then start the antibiotic (levaquin).

## 2014-08-30 ENCOUNTER — Ambulatory Visit: Payer: Self-pay | Admitting: Emergency Medicine

## 2014-08-30 ENCOUNTER — Emergency Department: Payer: Self-pay | Admitting: Emergency Medicine

## 2015-05-30 ENCOUNTER — Encounter: Payer: Self-pay | Admitting: Vascular Surgery

## 2015-06-06 ENCOUNTER — Encounter: Payer: Self-pay | Admitting: Vascular Surgery

## 2015-07-04 ENCOUNTER — Encounter: Payer: Self-pay | Admitting: Vascular Surgery

## 2015-07-05 ENCOUNTER — Encounter: Payer: Self-pay | Admitting: Gynecology

## 2015-07-05 ENCOUNTER — Ambulatory Visit
Admission: EM | Admit: 2015-07-05 | Discharge: 2015-07-05 | Disposition: A | Discharge status: Home / Self Care | Payer: 59 | Attending: Family Medicine | Admitting: Family Medicine

## 2015-07-05 DIAGNOSIS — J0110 Acute frontal sinusitis, unspecified: Secondary | ICD-10-CM | POA: Diagnosis not present

## 2015-07-05 DIAGNOSIS — J0100 Acute maxillary sinusitis, unspecified: Secondary | ICD-10-CM

## 2015-07-05 MED ORDER — GUAIFENESIN-CODEINE 100-10 MG/5ML PO SOLN: 10.0000 mL | Freq: Three times a day (TID) | ORAL | Status: DC | PRN

## 2015-07-05 MED ORDER — AMOXICILLIN-POT CLAVULANATE 875-125 MG PO TABS: 1.0000 | ORAL_TABLET | Freq: Two times a day (BID) | ORAL | Status: DC

## 2015-07-05 NOTE — ED Notes (Signed)
Patient c/o sinus infection. Per pt. Facial pressure/ greenish mucus / coughing x 1 week.

## 2015-07-05 NOTE — Discharge Instructions (Signed)

## 2015-07-05 NOTE — ED Provider Notes (Signed)
[facility]  ____________________________________________  Time seen: Approximately 9:31 AM  I have reviewed the triage vital signs and the nursing notes.   HISTORY  Chief Complaint Facial Pain   HPI Jeffery Mcmillan is a 50 y.o. male  presents with complaints of one week of runny nose, nasal congestion, sinus pressure and green sinus drainage. Reports current sinus pressure is 4/10 aching. Denies pain radiation. Denies fevers. Reports unrelieved with over-the-counter cough and congestion medications. Denies sick contacts at home. Reports sick contacts at work. Reports continues to eat and drink well.  Denies chest pain or shortness breath, abdominal pain, dizziness, weakness neck or back pain.   History reviewed. No pertinent past medical history.  There are no active problems to display for this patient.   Past Surgical History  Procedure Laterality Date  . Knee arthroscopy Right   . Shoulder arthroscopy Right     No current outpatient prescriptions on file.  Allergies Review of patient's allergies indicates no known allergies.  No family history on file.  Social History Social History  Substance Use Topics  . Smoking status: Never Smoker   . Smokeless tobacco: None  . Alcohol Use: No    Review of Systems Constitutional: No fever/chills Eyes: No visual changes. ENT: No sore throat. Positive runny nose, nasal drainage, and sinus pressure.  Cardiovascular: Denies chest pain. Respiratory: Denies shortness of breath. Gastrointestinal: No abdominal pain.  No nausea, no vomiting.  No diarrhea.  No constipation. Genitourinary: Negative for dysuria. Musculoskeletal: Negative for back pain. Skin: Negative for rash. Neurological: Negative for headaches, focal weakness or numbness.  10-point ROS otherwise negative.  ____________________________________________   PHYSICAL EXAM:  VITAL SIGNS: ED Triage Vitals  Enc Vitals Group     BP 07/05/15 0835  129/82 mmHg     Pulse Rate 07/05/15 0835 92     Resp 07/05/15 0835 16     Temp 07/05/15 0835 98.2 F (36.8 C)     Temp Source 07/05/15 0835 Oral     SpO2 07/05/15 0835 98 %     Weight --      Height 07/05/15 0835 5\' 9"  (1.753 m)     Head Cir --      Peak Flow --      Pain Score 07/05/15 0840 0     Pain Loc --      Pain Edu? --      Excl. in GC? --     Constitutional: Alert and oriented. Well appearing and in no acute distress. Eyes: Conjunctivae are normal. PERRL. EOMI. Head: Atraumatic. Mild to moderate tenderness to palpation of frontal and maxillary sinuses. No swelling. No erythema.  Ears: no erythema, normal TMs bilaterally.   Nose: Nasal congestion with bilateral nasal turbinate erythema.   Mouth/Throat: Mucous membranes are moist.  Oropharynx non-erythematous. No tonsillar swelling or exudate. Neck: No stridor.  No cervical spine tenderness to palpation. Hematological/Lymphatic/Immunilogical: No cervical lymphadenopathy. Cardiovascular: Normal rate, regular rhythm. Grossly normal heart sounds.  Good peripheral circulation. Respiratory: Normal respiratory effort.  No retractions. Lungs CTAB. No wheezes, rales or rhonchi. Good air movement. Gastrointestinal: Soft and nontender. Musculoskeletal: No lower or upper extremity tenderness nor edema.   Neurologic:  Normal speech and language. No gross focal neurologic deficits are appreciated. No gait instability. Skin:  Skin is warm, dry and intact. No rash noted. Psychiatric: Mood and affect are normal. Speech and behavior are normal.   ____________________________________________   LABS (all labs ordered are listed, but only abnormal  results are displayed)  Labs Reviewed - No data to display ____________________________________________   INITIAL IMPRESSION / ASSESSMENT AND PLAN / ED COURSE  Pertinent labs & imaging results that were available during my care of the patient were reviewed by me and considered in my medical  decision making (see chart for details).  Very well-appearing patient. No acute distress. Presents for complaints of 1 week of runny nose, nasal congestion, sinus drainage and sinus pressure. Lungs clear throughout. Abdomen soft and nontender. Will treat frontal and maxillary sinuses with oral Augmentin, when necessary guaifenesin with codeine at night, over-the-counter Claritin-D, encourage rest, fluids and PCP follow-up.  Discussed follow up with Primary care physician this week. Discussed follow up and return parameters including no resolution or any worsening concerns. Patient verbalized understanding and agreed to plan.   ____________________________________________   FINAL CLINICAL IMPRESSION(S) / ED DIAGNOSES  Final diagnoses:  Acute maxillary sinusitis, recurrence not specified  Acute frontal sinusitis, recurrence not specified       Renford DillsLindsey Daneka Lantigua, NP 07/05/15 [phone]

## 2015-07-11 ENCOUNTER — Ambulatory Visit (INDEPENDENT_AMBULATORY_CARE_PROVIDER_SITE_OTHER): Payer: 59 | Admitting: Vascular Surgery

## 2015-07-11 ENCOUNTER — Encounter: Payer: Self-pay | Admitting: Vascular Surgery

## 2015-07-11 VITALS — BP 128/81 | HR 98 | Temp 98.3°F | Resp 16 | Ht 69.0 in | Wt 181.0 lb

## 2015-07-11 DIAGNOSIS — I8393 Asymptomatic varicose veins of bilateral lower extremities: Secondary | ICD-10-CM | POA: Diagnosis not present

## 2015-07-11 DIAGNOSIS — I7789 Other specified disorders of arteries and arterioles: Secondary | ICD-10-CM

## 2015-07-11 NOTE — Progress Notes (Signed)
Referred by:  Salvatore Marvel, MD [address] [place], [place] [address]  Reason for referral: right calf aberrant vein  History of Present Illness  Jeffery Mcmillan is a 50 y.o. (1965/07/02) male who presents with chief complaint: right posterior knee pain.  Onset of symptom occurred >6 month ago.  The patient has previously a R ATS completed. Pain is described as sharp, severity 1-10/10, and associated with activity especially leg curls.  Patient has attempted to treat this pain with rest and Orthopedic evaluation.  Reportedly large vein visualized in R calf.  The patient has no rest pain symptoms also and no leg wounds/ulcers.  Atherosclerotic risk factors include: none.   Past Medical History: R ankle tendonitis R hip/ASIS tendonitis   Past Surgical History  Procedure Laterality Date  . Knee arthroscopy Right   . Shoulder arthroscopy Right     Social History   Social History  . Marital Status: Married    Spouse Name: N/A  . Number of Children: N/A  . Years of Education: N/A   Occupational History  . Not on file.   Social History Main Topics  . Smoking status: Never Smoker   . Smokeless tobacco: Never Used  . Alcohol Use: No  . Drug Use: No  . Sexual Activity: Not on file   Other Topics Concern  . Not on file   Social History Narrative    Family History  Problem Relation Age of Onset  . Diabetes Mother   . Hypertension Father     Current Outpatient Prescriptions  Medication Sig Dispense Refill  . amoxicillin-clavulanate (AUGMENTIN) 875-125 MG tablet Take 1 tablet by mouth every 12 (twelve) hours. 20 tablet 0  . CHERATUSSIN AC 100-10 MG/5ML syrup     . guaiFENesin-codeine 100-10 MG/5ML syrup Take 10 mLs by mouth 3 (three) times daily as needed for cough. (Patient not taking: Reported on 07/11/2015) 60 mL 0   No current facility-administered medications for this visit.    No Known Allergies   REVIEW OF SYSTEMS:  (Positives checked  otherwise negative)  CARDIOVASCULAR:    chest pain,   chest pressure,   palpitations,   shortness of breath when laying flat,   shortness of breath with exertion,    pain in feet when walking,   pain in feet when laying flat,  history of blood clot in veins (DVT),   history of phlebitis,   swelling in legs,   varicose veins  PULMONARY:    productive cough,   asthma,   wheezing  NEUROLOGIC:    weakness in arms or legs,   numbness in arms or legs,   difficulty speaking or slurred speech,   temporary loss of vision in one eye,   dizziness  HEMATOLOGIC:    bleeding problems,   problems with blood clotting too easily  MUSCULOSKEL:    joint pain,  joint swelling  GASTROINTEST:    vomiting blood,   blood in stool     GENITOURINARY:    burning with urination,   blood in urine  PSYCHIATRIC:    history of major depression  INTEGUMENTARY:    rashes,   ulcers  CONSTITUTIONAL:    fever,   chills   For VQI Use Only  PRE-ADM LIVING: Home  AMB STATUS: Ambulatory  CAD Sx: None  PRIOR CHF: None  STRESS TEST:  No,  Normal,  + ischemia,  + MI,  Both   Physical Examination  Filed Vitals:   07/11/15 1323  BP: 128/81  Pulse: 98  Temp: 98.3 F (36.8 C)  TempSrc: Oral  Resp: 16  Height:  (1.753 m)  Weight: 181 lb (82.101 kg)  SpO2: 96%   Body mass index is 26.72 kg/(m^2).  General: A&O x 3, WDWN  Head: Morton/AT  Ear/Nose/Throat: Hearing grossly intact, nares without erythema or drainage, oropharynx without Erythema/Exudate, Mallampati score: 2  Eyes: PERRLA, EOMI  Neck: Supple, no nuchal rigidity, no palpable LAD  Pulmonary: Sym exp, good air movt, CTAB, no rales, rhonchi, & wheezing  Cardiac: RRR, Nl S1, S2, no Murmurs, rubs or gallops  Vascular: Vessel Right Left  Radial Palpable Palpable  Brachial Palpable Palpable  Carotid  Palpable, without bruit Palpable, without bruit  Aorta Not palpable N/A  Femoral Palpable Palpable  Popliteal Faintly palpable Faintly palpable  PT Palpable Palpable  DP Palpable Palpable   Gastrointestinal: soft, NTND, no G/R, no HSM, no masses, no CVAT B  Musculoskeletal: M/S 5/5 throughout , Extremities without ischemic changes, Prominent GSV bilaterally, no edema, no LDS  Neurologic: CN 2-12 intact , Pain and light touch intact in extremities , Motor exam as listed above  Psychiatric: Judgment intact, Mood & affect appropriate for pt's clinical situation  Dermatologic: See M/S exam for extremity exam, no rashes otherwise noted  Lymph : No Cervical, Axillary, or Inguinal lymphadenopathy    Outside Studies/Documentation 4 pages of outside documents were reviewed including: outside ortho note .  Medical Decision Making  Jeffery Mcmillan is a 50 y.o. male who presents with: atypical R calf/knee pain during exertion, BLE varicose veins   Pt's history has some elements of exertional compartment syndrome and some elements suggestive of popliteal entrapment.  Will order BLE arterial duplex with popliteal entrapment manuevers to evaluate the BLE arterial system then try to provoke abatement of the right and left popliteal arterial flow with DF/PF.  This should give [place] some idea if he might have popliteal entrapment.  I have also ordered BLE venous insufficiency evaluation to evaluate the venous system bilaterally, though I doubt the venous system is contributing to this patient's sx.  The studies have been ordered with follow up in 2-4 weeks.  Thank you for allowing [place] to participate in this patient's care.   Leonides Sake, MD [facility] Office: [phone] Pager: [phone]  07/11/2015, 2:43 PM

## 2015-07-11 NOTE — Addendum Note (Signed)
Addended by: Adria Dill L on: 07/11/2015 03:22 PM   Modules accepted: Orders

## 2015-07-31 ENCOUNTER — Encounter: Payer: Self-pay | Admitting: Vascular Surgery

## 2015-08-04 ENCOUNTER — Telehealth: Payer: Self-pay | Admitting: Vascular Surgery

## 2015-08-04 NOTE — Telephone Encounter (Signed)
**  pt called at 1050 am 08/04/2015 to say he will not be in to attend his appointment; he states he called last week..ad Please call pt at [phone]

## 2015-08-05 ENCOUNTER — Encounter (HOSPITAL_COMMUNITY): Payer: 59

## 2015-08-06 ENCOUNTER — Ambulatory Visit: Payer: 59 | Admitting: Vascular Surgery

## 2015-08-06 NOTE — Telephone Encounter (Signed)
Patient was in the middle of class when I returned his call. He stated he would have to call me back later today. dpm

## 2015-12-24 ENCOUNTER — Encounter: Payer: Self-pay | Admitting: Adult Health

## 2016-02-10 IMAGING — CR RIGHT HAND - COMPLETE 3+ VIEW
1 series · 3 of 3 positions shown · non-contrast
Comparison: Right hand radiographs performed earlier today at [DATE]
p.m.

CLINICAL DATA: Acute onset of third distal metacarpal popping out
of and back into joint while working with crowbar. Initial
encounter.

EXAM:
RIGHT HAND - COMPLETE 3+ VIEW

[Series 1: dxr hand rt complete w/obliques · 0.14mm/px · 3 of 3 slices shown]
[im 1/3]
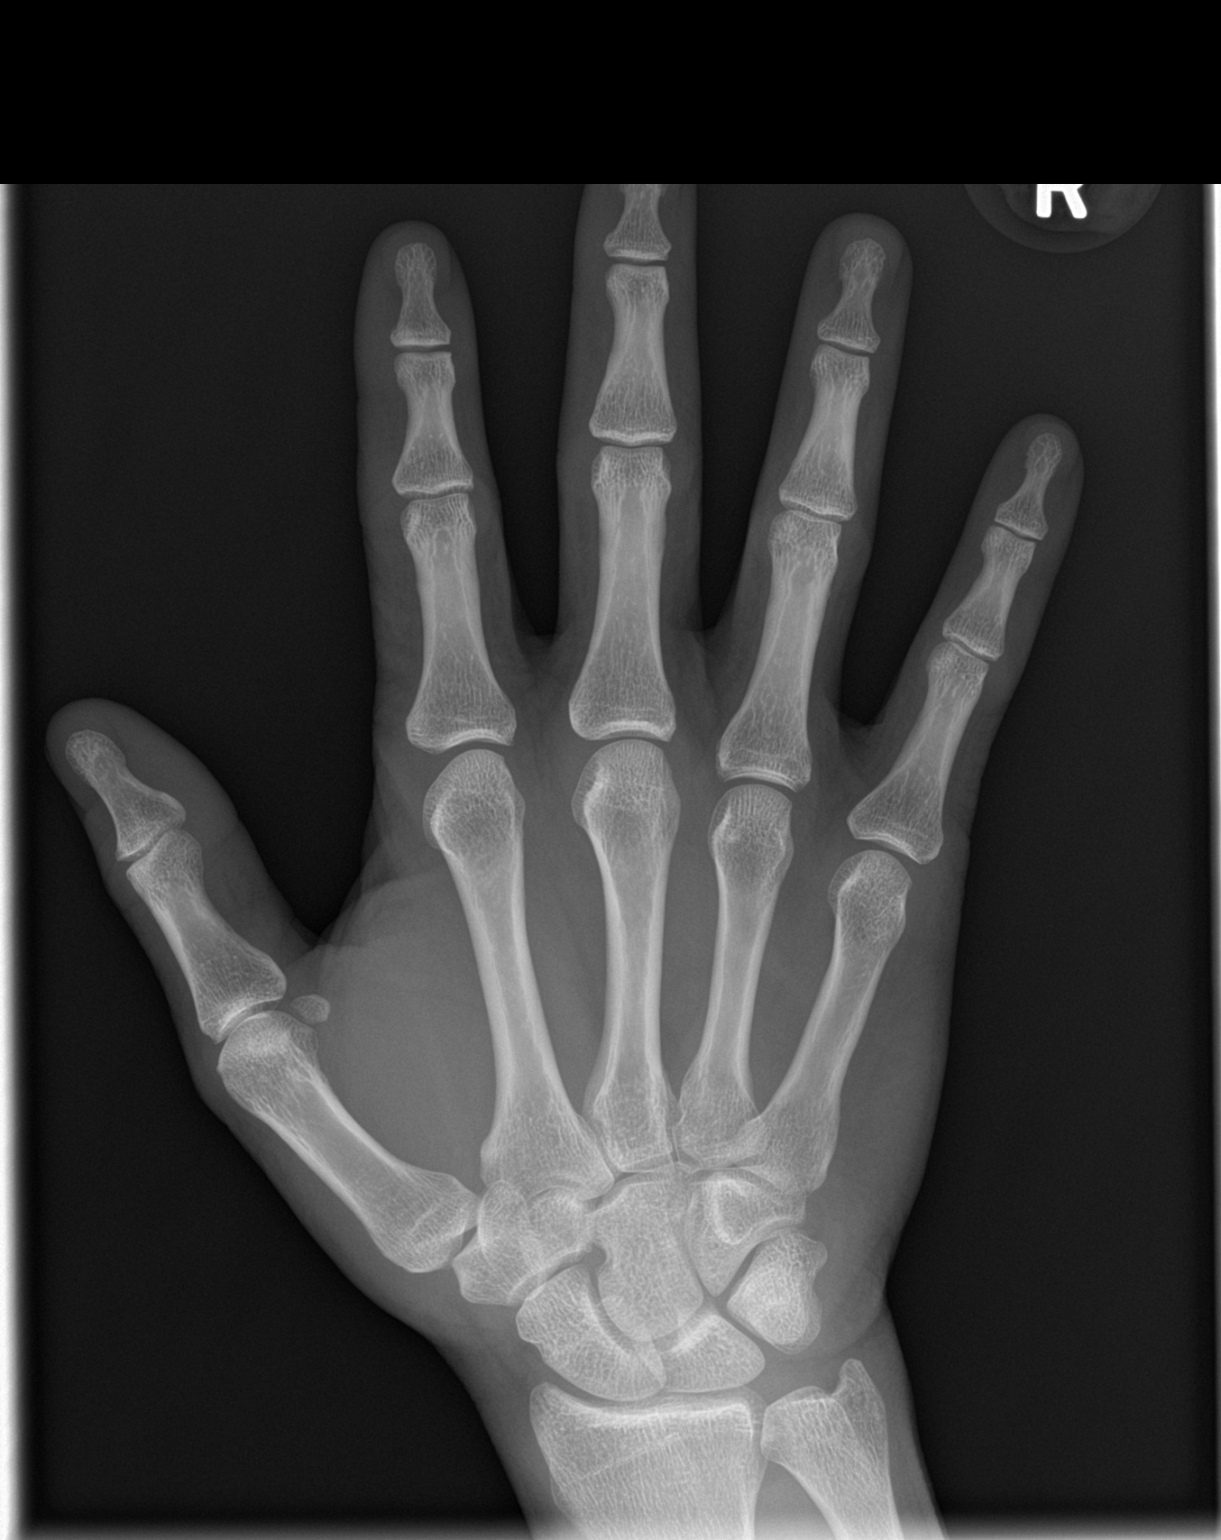
[im 2/3]
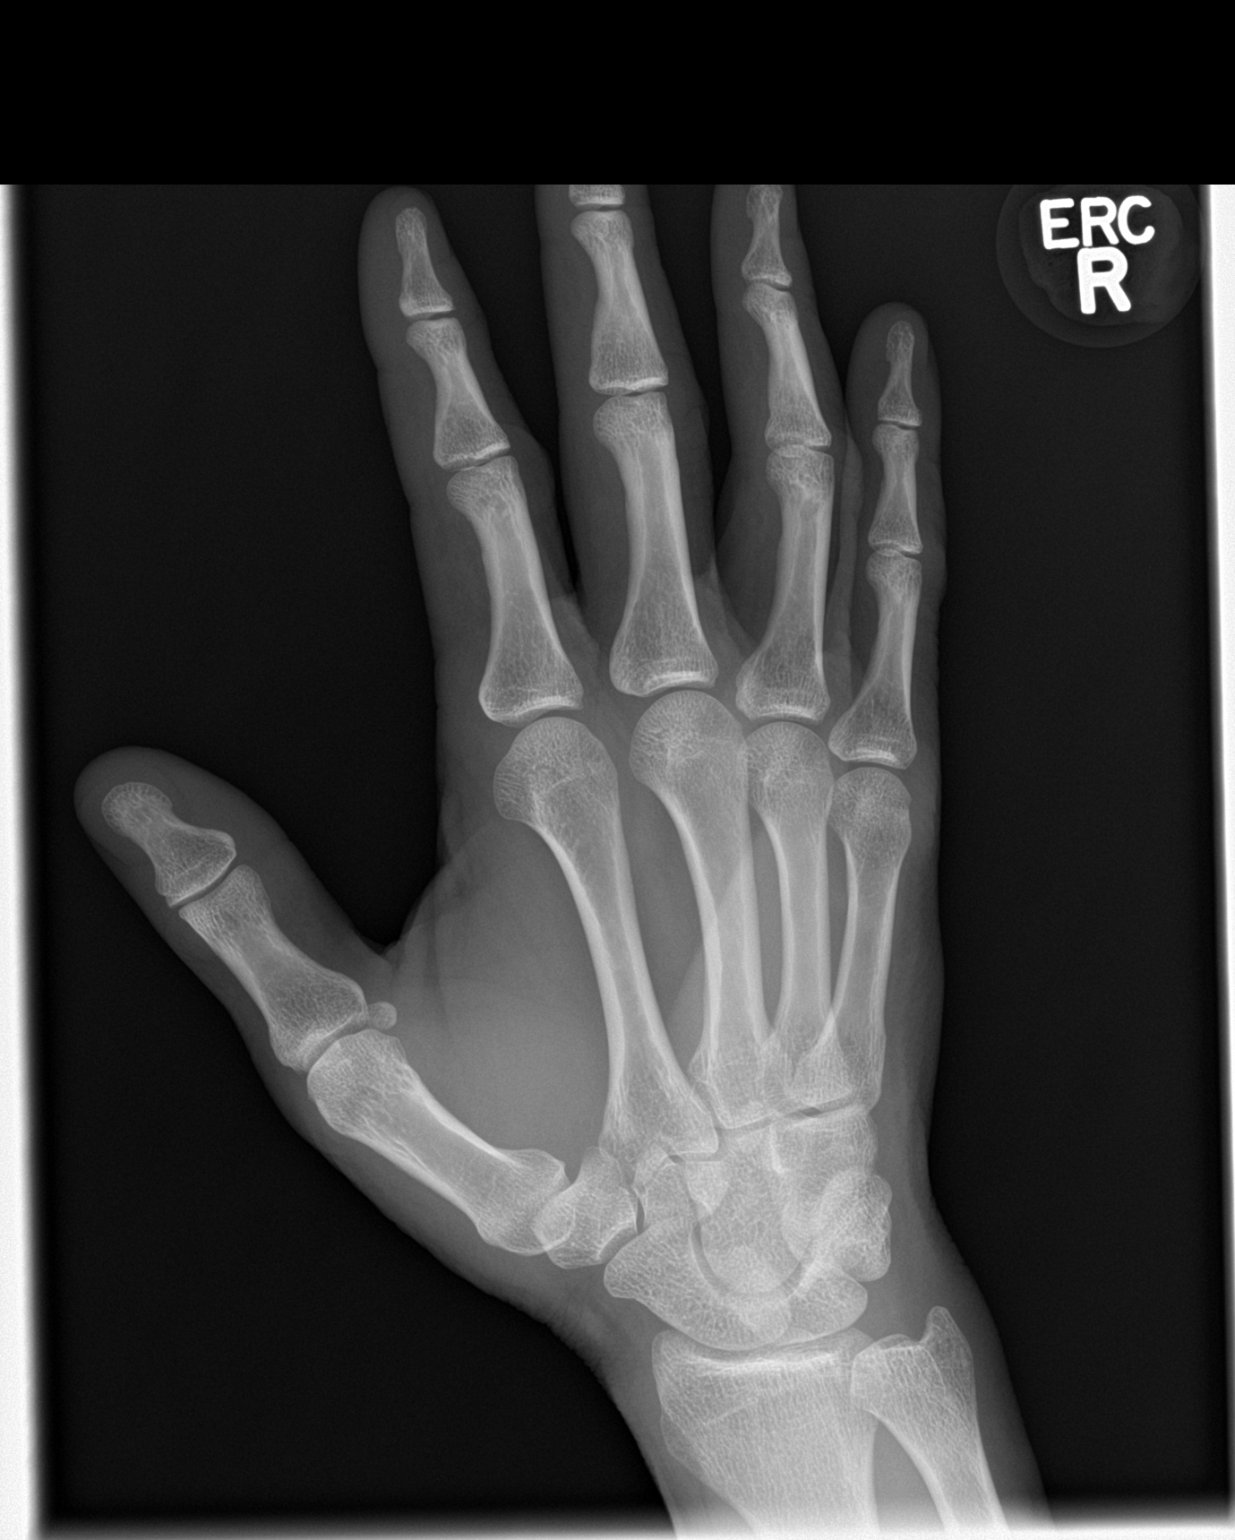
[im 3/3]
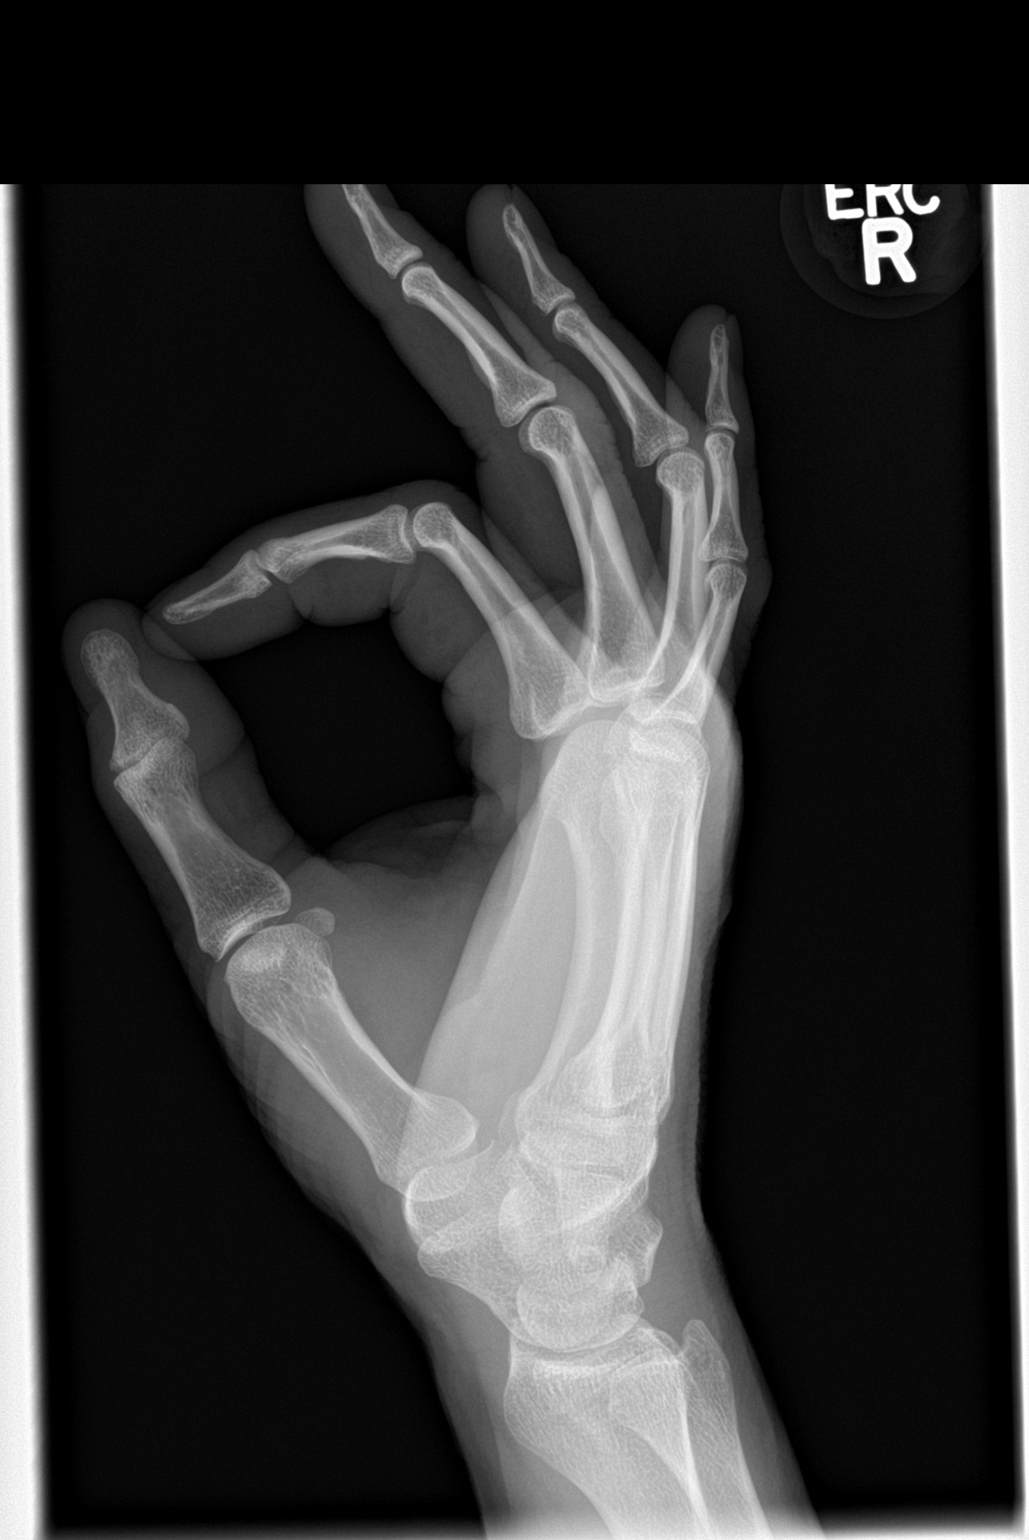

[3 of 3 positions shown; findings below may reference images not displayed]

FINDINGS: There is no evidence of dislocation at this time.

There is no evidence of fracture. The third metacarpophalangeal
joint is grossly unremarkable in appearance on radiograph. The joint
spaces are preserved. The carpal rows are intact, and demonstrate
normal alignment. The soft tissues are unremarkable in appearance.
IMPRESSION: No evidence of fracture or dislocation.

## 2017-02-02 ENCOUNTER — Telehealth: Payer: Self-pay | Admitting: Internal Medicine

## 2017-02-02 NOTE — Telephone Encounter (Signed)
Dr. Darrick Huntsmanullo has ok'd for pt to re-establish with her. Pt was called and lm on vm to call office and make a new pt appt.

## 2017-02-07 ENCOUNTER — Telehealth: Payer: Self-pay | Admitting: Internal Medicine

## 2017-02-07 DIAGNOSIS — E161 Other hypoglycemia: Secondary | ICD-10-CM

## 2017-02-07 DIAGNOSIS — E785 Hyperlipidemia, unspecified: Secondary | ICD-10-CM

## 2017-02-07 DIAGNOSIS — R5383 Other fatigue: Secondary | ICD-10-CM

## 2017-02-07 NOTE — Telephone Encounter (Signed)
jePt would like to get labs done before his physical appt. Order is needed. Thank you!  Call pt @ [phone].

## 2017-02-09 NOTE — Telephone Encounter (Signed)
Labs have been ordered and pt has been scheduled for a lab appt. Pt is aware of appt date and time.  

## 2017-03-23 ENCOUNTER — Other Ambulatory Visit (INDEPENDENT_AMBULATORY_CARE_PROVIDER_SITE_OTHER): Payer: 59

## 2017-03-23 DIAGNOSIS — R5383 Other fatigue: Secondary | ICD-10-CM | POA: Diagnosis not present

## 2017-03-23 DIAGNOSIS — E161 Other hypoglycemia: Secondary | ICD-10-CM | POA: Diagnosis not present

## 2017-03-23 DIAGNOSIS — E785 Hyperlipidemia, unspecified: Secondary | ICD-10-CM | POA: Diagnosis not present

## 2017-03-23 LAB — LIPID PANEL
Cholesterol: 194 mg/dL (ref 0–200)
HDL: 48.5 mg/dL (ref >39.00)
LDL Cholesterol: 130 mg/dL — ABNORMAL HIGH (ref 0–99)
NonHDL: 145.97
Total CHOL/HDL Ratio: 4
Triglycerides: 80 mg/dL (ref 0.0–149.0)
VLDL: 16 mg/dL (ref 0.0–40.0)

## 2017-03-23 LAB — CBC WITH DIFFERENTIAL/PLATELET
BASOS PCT: 0.7 % (ref 0.0–3.0)
Basophils Absolute: 0 10*3/uL (ref 0.0–0.1)
EOS ABS: 0.1 10*3/uL (ref 0.0–0.7)
Eosinophils Relative: 1.6 % (ref 0.0–5.0)
HEMATOCRIT: 47.7 % (ref 39.0–52.0)
Hemoglobin: 15.8 g/dL (ref 13.0–17.0)
LYMPHS PCT: 30.5 % (ref 12.0–46.0)
Lymphs Abs: 1.8 10*3/uL (ref 0.7–4.0)
MCHC: 33 g/dL (ref 30.0–36.0)
MCV: 90.5 fl (ref 78.0–100.0)
MONOS PCT: 8.7 % (ref 3.0–12.0)
Monocytes Absolute: 0.5 10*3/uL (ref 0.1–1.0)
NEUTROS ABS: 3.4 10*3/uL (ref 1.4–7.7)
Neutrophils Relative %: 58.5 % (ref 43.0–77.0)
PLATELETS: 217 10*3/uL (ref 150.0–400.0)
RBC: 5.28 Mil/uL (ref 4.22–5.81)
RDW: 12.6 % (ref 11.5–15.5)
WBC: 5.7 10*3/uL (ref 4.0–10.5)

## 2017-03-23 LAB — LDL CHOLESTEROL, DIRECT: Direct LDL: 134 mg/dL

## 2017-03-23 LAB — COMPREHENSIVE METABOLIC PANEL
ALT: 16 U/L (ref 0–53)
AST: 17 U/L (ref 0–37)
Albumin: 4.1 g/dL (ref 3.5–5.2)
Alkaline Phosphatase: 36 U/L — ABNORMAL LOW (ref 39–117)
BILIRUBIN TOTAL: 0.8 mg/dL (ref 0.2–1.2)
BUN: 19 mg/dL (ref 6–23)
CO2: 30 meq/L (ref 19–32)
Calcium: 9.3 mg/dL (ref 8.4–10.5)
Chloride: 104 mEq/L (ref 96–112)
Creatinine, Ser: 1.12 mg/dL (ref 0.40–1.50)
GFR: 73.81 mL/min (ref >60.00)
GLUCOSE: 104 mg/dL — AB (ref 70–99)
Potassium: 4.3 mEq/L (ref 3.5–5.1)
SODIUM: 139 meq/L (ref 135–145)
Total Protein: 6.8 g/dL (ref 6.0–8.3)

## 2017-03-23 LAB — HEMOGLOBIN A1C: HEMOGLOBIN A1C: 6 % (ref 4.6–6.5)

## 2017-03-23 LAB — VITAMIN D 25 HYDROXY (VIT D DEFICIENCY, FRACTURES): VITD: 33.12 ng/mL (ref 30.00–100.00)

## 2017-03-23 LAB — TSH: TSH: 1.34 u[IU]/mL (ref 0.35–4.50)

## 2017-03-25 ENCOUNTER — Ambulatory Visit (INDEPENDENT_AMBULATORY_CARE_PROVIDER_SITE_OTHER): Payer: 59 | Admitting: Internal Medicine

## 2017-03-25 ENCOUNTER — Encounter: Payer: Self-pay | Admitting: Internal Medicine

## 2017-03-25 ENCOUNTER — Ambulatory Visit (INDEPENDENT_AMBULATORY_CARE_PROVIDER_SITE_OTHER): Payer: 59

## 2017-03-25 VITALS — BP 130/88 | HR 78 | Temp 98.9°F | Resp 15 | Ht 69.0 in | Wt 180.6 lb

## 2017-03-25 DIAGNOSIS — R7303 Prediabetes: Secondary | ICD-10-CM | POA: Diagnosis not present

## 2017-03-25 DIAGNOSIS — G8929 Other chronic pain: Secondary | ICD-10-CM | POA: Diagnosis not present

## 2017-03-25 DIAGNOSIS — M542 Cervicalgia: Secondary | ICD-10-CM

## 2017-03-25 DIAGNOSIS — Z0000 Encounter for general adult medical examination without abnormal findings: Secondary | ICD-10-CM

## 2017-03-25 DIAGNOSIS — R030 Elevated blood-pressure reading, without diagnosis of hypertension: Secondary | ICD-10-CM

## 2017-03-25 DIAGNOSIS — M5412 Radiculopathy, cervical region: Secondary | ICD-10-CM

## 2017-03-25 DIAGNOSIS — Z23 Encounter for immunization: Secondary | ICD-10-CM

## 2017-03-25 DIAGNOSIS — Z1211 Encounter for screening for malignant neoplasm of colon: Secondary | ICD-10-CM

## 2017-03-25 MED ORDER — CELECOXIB 200 MG PO CAPS: 200.0000 mg | ORAL_CAPSULE | Freq: Two times a day (BID) | ORAL | 2 refills | Status: AC

## 2017-03-25 MED ORDER — CYCLOBENZAPRINE HCL 10 MG PO TABS: 10.0000 mg | ORAL_TABLET | Freq: Three times a day (TID) | ORAL | 0 refills | Status: AC | PRN

## 2017-03-25 NOTE — Patient Instructions (Addendum)
Trial of celebrex 200 mg twice daily instead of Advil  Add tylenol up to 2000 mg daily  Add flexeril at night for muscle spasm  If no improvement  in a few weeks , call for referral    To make a low carb chip :  Take the Joseph's Lavash or Pita bread,  Or the [place] Low carb whole wheat tortilla   Place on metal cookie sheet  Brush with olive oil  Sprinkle garlic powder (NOT garlic salt), grated parmesan cheese, mediterranean seasoning , or all of them?  Bake at 275 for 30 minutes   We have substitutions for your potatoes!!  Try the mashed cauliflower and riced cauliflower dishes instead of rice and mashed potatoes  Mashed turnips are also very low carb!   For desserts :  Try the Dannon Lt n Fit greek yogurt dessert flavors and top with reddi Whip .  8 carbs,  80 calories  Try Oikos Triple Zero [place] Yogurt in the salted caramel, and the coffee flavors  With Whipped Cream for dessert  breyer's low carb ice cream, available in bars (on a stick, better ) or scoopable ice cream  HERE ARE THE LOW CARB  BREAD CHOICES      1

## 2017-03-25 NOTE — Progress Notes (Signed)
Patient ID: Jeffery Mcmillan, male    DOB: 1965/11/25  Age: 51 y.o. MRN: [id number]  The patient is here for annual preventive examination and management of other chronic and acute problems.  Has been lost to follow up since 2013 ( 5 yrs ago)   The risk factors are reflected in the social history. Family history reviewed and updated   The roster of all physicians providing medical care to patient - is listed in the Snapshot section of the chart.  Activities of daily living:  The patient is 100% independent in all ADLs: dressing, toileting, feeding as well as independent mobility  Home safety : The patient has smoke detectors in the home. They wear seatbelts.  There are secured registered firearms at home. There is no violence in the home. Patient is a Emergency planning/management officer .  There is no risks for hepatitis, STDs or HIV. There is no   history of blood transfusion. They have no travel history to infectious disease endemic areas of the world.  The patient has seen their dentist in the last six month. They have seen their eye doctor in the last year.    Discussed the need for sun protection: hats, long sleeves and use of sunscreen if there is significant sun exposure.   Diet: the importance of a healthy diet is discussed. They do have a healthy diet.  The benefits of regular aerobic exercise were discussed. he exercises vigorously 5  times per week ,  60 minutes.   Depression screen: there are no signs or vegative symptoms of depression- irritability, change in appetite, anhedonia, sadness/tearfullness.  The following portions of the patient's history were reviewed and updated as appropriate: allergies, current medications, past family history, past medical history,  past surgical history, past social history  and problem list.  Visual acuity was not assessed per patient preference since she has regular follow up with her ophthalmologist. Hearing and body mass index were assessed and reviewed.    During the course of the visit the patient was educated and counseled about appropriate screening and preventive services including : fall prevention , diabetes screening, nutrition counseling, colorectal cancer screening, and recommended immunizations.    CC: The primary encounter diagnosis was Musculoskeletal neck pain. Diagnoses of Need for immunization against influenza, Prediabetes, Chronic midline posterior neck pain, Cervical radiculopathy at C7, Encounter for preventive health examination, and Elevated blood pressure reading without diagnosis of hypertension were also pertinent to this visit. Last seen 2013  Varicose veis:  Had  A surgical consult Jan 2017, never had the surgery,  No longer bothering him  Started having persistent neck pain currently radiating to right scapula and shoulder.brought on by bench pressing.  History  Of C5 disk disease by prior MRI which was done at [facility] remotely. In the past it was  Affecting the left side only and improved with activity modification and conservative measures       Taking 2 advil  Once or twice daily    Saw opthalmology for vision changes  That started in June /July. Had   5 or 6 episodes   accompanid by nausea and headache with photophobia  . describes the vision changes as blank places in his field of vision    Told his vision was fine. MRI Brain not done.  History of cluster headaches in high school.    Discussed referral to  gen surg for colonoscopy    History of right shoulder and right knee arthroscopies  Collarbone  impingement , remotely for meniscal  tear   Chronic left wall chest pain from torn muscle    History Jeffery Mcmillan has a past medical history of History of chicken pox.   He has a past surgical history that includes Knee arthroscopy (Right) and Shoulder arthroscopy (Right).   His family history includes Diabetes in his mother; Hypertension in his father and mother.He reports that he has never smoked. He has never used  smokeless tobacco. He reports that he does not drink alcohol or use drugs.  Outpatient Medications Prior to Visit  Medication Sig Dispense Refill  . amoxicillin-clavulanate (AUGMENTIN) 875-125 MG tablet Take 1 tablet by mouth every 12 (twelve) hours. 20 tablet 0  . CHERATUSSIN AC 100-10 MG/5ML syrup     . fluticasone (FLONASE) 50 MCG/ACT nasal spray Place 2 sprays into the nose daily. 16 g 6  . guaiFENesin-codeine 100-10 MG/5ML syrup Take 10 mLs by mouth 3 (three) times daily as needed for cough. 60 mL 0  . levofloxacin (LEVAQUIN) 500 MG tablet Take 1 tablet (500 mg total) by mouth daily. Take 1 tablet daily for 10 days. 10 tablet 0  . predniSONE (DELTASONE) 10 MG tablet Take  on day 1 and taper by  daily until complete. 21 tablet 0   No facility-administered medications prior to visit.     Review of Systems   Patient denies, fevers, malaise, unintentional weight loss, skin rash, eye pain, sinus congestion and sinus pain, sore throat, dysphagia,  hemoptysis , cough, dyspnea, wheezing, chest pain, palpitations, orthopnea, edema, abdominal pain, melena, diarrhea, constipation, flank pain, dysuria, hematuria, urinary  Frequency, nocturia, numbness, tingling, seizures,  Focal weakness, Loss of consciousness,  Tremor, insomnia, depression, anxiety, and suicidal ideation.      Objective:  BP 130/88 (BP Location: Left Arm, Patient Position: Sitting, Cuff Size: Normal)   Pulse 78   Temp 98.9 F (37.2 C) (Oral)   Resp 15   Ht  (1.753 m)   Wt 180 lb 9.6 oz (81.9 kg)   SpO2 96%   BMI 26.67 kg/m   Physical Exam   General appearance: alert, cooperative and appears stated age Ears: normal TM's and external ear canals both ears Throat: lips, mucosa, and tongue normal; teeth and gums normal Neck: no adenopathy, no carotid bruit, supple, symmetrical, trachea midline and thyroid not enlarged, symmetric, no tenderness/mass/nodules Back: symmetric, no curvature. ROM normal. No CVA  tenderness. Lungs: clear to auscultation bilaterally Heart: regular rate and rhythm, S1, S2 normal, no murmur, click, rub or gallop Abdomen: soft, non-tender; bowel sounds normal; no masses,  no organomegaly Pulses: 2+ and symmetric Skin: Skin color, texture, turgor normal. No rashes or lesions Lymph nodes: Cervical, supraclavicular, and axillary nodes normal.   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 He will check his blood pressure several times  over the next 3-4 weeks and to submit readings for evaluation.       Encounter for preventive health examination    Annual comprehensive preventive exam was done as well as an evaluation and management of acute and chronic conditions .  During the course of the visit the patient was educated and counseled about appropriate screening and preventive services including :  diabetes screening, lipid analysis with projected  10 year  risk for CAD , nutrition counseling, prostate and colorectal cancer screening, and recommended immunizations.  Printed recommendations for health maintenance screenings was given.       Prediabetes    No recurrences of hypoglycemia in years with dietary changes and weight loss.  a1c is stable.  Lab Results  Component Value Date   HGBA1C 6.0 03/23/2017          Other Visit Diagnoses    Musculoskeletal  neck pain    -  Primary   Relevant Orders   DG Cervical Spine Complete (Completed)   Need for immunization against influenza       Relevant Orders   Flu Vaccine QUAD 36+ mos IM (Completed)      I have discontinued Mr. Mckelvy fluticasone, levofloxacin, predniSONE, amoxicillin-clavulanate, guaiFENesin-codeine, and CHERATUSSIN AC. I am also having him start on celecoxib and cyclobenzaprine.  Meds ordered this encounter  Medications  . celecoxib (CELEBREX) 200 MG capsule    Sig: Take 1 capsule (200 mg total) by mouth 2 (two) times daily.    Dispense:  60 capsule    Refill:  2  . cyclobenzaprine (FLEXERIL) 10 MG tablet    Sig: Take 1 tablet (10 mg total) by mouth 3 (three) times daily as needed for muscle spasms.    Dispense:  90 tablet    Refill:  0    Medications Discontinued During This Encounter  Medication Reason  . predniSONE (DELTASONE) 10 MG tablet Patient has not taken in last 30 days  . levofloxacin (LEVAQUIN) 500 MG tablet Patient has not taken in last 30 days  . guaiFENesin-codeine 100-10 MG/5ML syrup Patient has not taken in last 30 days  . fluticasone (FLONASE) 50 MCG/ACT nasal spray Patient has not taken in last 30 days  . CHERATUSSIN AC 100-10 MG/5ML syrup Patient has not taken in last 30 days  . amoxicillin-clavulanate (AUGMENTIN) 875-125 MG tablet Patient has not taken in last 30 days    Follow-up: No Follow-up on file.   Sherlene Shams, MD

## 2017-03-27 ENCOUNTER — Encounter: Payer: Self-pay | Admitting: Internal Medicine

## 2017-03-27 DIAGNOSIS — Z0000 Encounter for general adult medical examination without abnormal findings: Secondary | ICD-10-CM | POA: Insufficient documentation

## 2017-03-27 DIAGNOSIS — G8929 Other chronic pain: Secondary | ICD-10-CM | POA: Insufficient documentation

## 2017-03-27 DIAGNOSIS — M5412 Radiculopathy, cervical region: Secondary | ICD-10-CM | POA: Insufficient documentation

## 2017-03-27 DIAGNOSIS — M542 Cervicalgia: Secondary | ICD-10-CM

## 2017-03-27 DIAGNOSIS — R030 Elevated blood-pressure reading, without diagnosis of hypertension: Secondary | ICD-10-CM | POA: Insufficient documentation

## 2017-03-27 NOTE — Assessment & Plan Note (Signed)

## 2017-03-27 NOTE — Assessment & Plan Note (Signed)
He has no prior history of hypertension. He will check his blood pressure several times over the next 3-4 weeks and to submit readings for evaluation.  

## 2017-03-27 NOTE — Assessment & Plan Note (Addendum)
No recurrences of hypoglycemia in years with dietary changes and weight loss.  a1c is stable.  Lab Results  Component Value Date   HGBA1C 6.0 03/23/2017

## 2017-03-27 NOTE — Assessment & Plan Note (Signed)
Trail of celebrex and muscle relaxer,  If no improvement MRi spine

## 2017-03-27 NOTE — Assessment & Plan Note (Signed)
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

## 2017-03-28 ENCOUNTER — Telehealth: Payer: Self-pay | Admitting: General Surgery

## 2017-03-28 NOTE — Telephone Encounter (Signed)
I CALLED PATIENT AND LEFT A MESSAGE FOR PATIENT TO CALL & SCHEDULE AN  APPOINTMENT WITH DR BYRNETT. FOR SCREENING COLONOSCOPY FOR December.PATIENT TURNS 51 ON  05-23-17. PLEASE CHECK TO SEE IF HE'S HAD ANY IN THE PAST.

## 2017-04-20 ENCOUNTER — Encounter: Payer: Self-pay | Admitting: *Deleted

## 2017-04-26 ENCOUNTER — Encounter: Payer: Self-pay | Admitting: General Surgery

## 2017-04-26 ENCOUNTER — Ambulatory Visit: Payer: 59 | Admitting: General Surgery

## 2017-04-26 VITALS — BP 124/72 | HR 70 | Resp 14 | Ht 69.0 in | Wt 183.0 lb

## 2017-04-26 DIAGNOSIS — Z1211 Encounter for screening for malignant neoplasm of colon: Secondary | ICD-10-CM | POA: Diagnosis not present

## 2017-04-26 MED ORDER — POLYETHYLENE GLYCOL 3350 17 GM/SCOOP PO POWD: 1.0000 | Freq: Once | ORAL | 0 refills | Status: AC

## 2017-04-26 NOTE — Progress Notes (Signed)
Patient ID: Jeffery Mcmillan, male   DOB: 04/06/1966, 51 y.o.   MRN: [id number]  Chief Complaint  Patient presents with  . Colonoscopy    HPI Jeffery Mcmillan is a 51 y.o. male here today for a evaluation of a screening colonoscopy. Patient states no GI problems at this time. Moves his bowels daily.  HPI  Past Medical History:  Diagnosis Date  . History of chicken pox     Past Surgical History:  Procedure Laterality Date  . KNEE ARTHROSCOPY Right   . SHOULDER ARTHROSCOPY Right     Family History  Problem Relation Age of Onset  . Hypertension Mother   . Diabetes Mother   . Hypertension Father     Social History Social History   Tobacco Use  . Smoking status: Never Smoker  . Smokeless tobacco: Never Used  Substance Use Topics  . Alcohol use: No  . Drug use: No    No Known Allergies  Current Outpatient Medications  Medication Sig Dispense Refill  . celecoxib (CELEBREX) 200 MG capsule Take 1 capsule (200 mg total) by mouth 2 (two) times daily. 60 capsule 2  . cyclobenzaprine (FLEXERIL) 10 MG tablet Take 1 tablet (10 mg total) by mouth 3 (three) times daily as needed for muscle spasms. 90 tablet 0   No current facility-administered medications for this visit.     Review of Systems Review of Systems  Constitutional: Negative.   Respiratory: Negative.   Cardiovascular: Negative.   Gastrointestinal: Negative.     Blood pressure 124/72, pulse 70, resp. rate 14, height 5\' 9"  (1.753 m), weight 183 lb (83 kg).  Physical Exam Physical Exam  Constitutional: He is oriented to person, place, and time. He appears well-developed and well-nourished.  Cardiovascular: Normal rate, regular rhythm and normal heart sounds.  Pulmonary/Chest: Effort normal and breath sounds normal.  Neurological: He is alert and oriented to person, place, and time.  Skin: Skin is warm and dry.    Data Reviewed Laboratory studies dated 03/23/2017 reviewed. Hemoglobin 15.8, MCV 90.5, white blood  cell count 5700, platelet count 217,000. Competency metabolic panel of the same date notable for a blood sugar of 104, normal electrolytes and liver function studies.  Assessment    Candidate for screening colonoscopy.    Plan        Colonoscopy with possible biopsy/polypectomy prn: Information regarding the procedure, including its potential risks and complications (including but not limited to perforation of the bowel, which may require emergency surgery to repair, and bleeding) was verbally given to the patient. Educational information regarding lower intestinal endoscopy was given to the patient. Written instructions for how to complete the bowel prep using Miralax were provided. The importance of drinking ample fluids to avoid dehydration as a result of the prep emphasized.  HPI, Physical Exam, Assessment and Plan have been scribed under the direction and in the presence of Donnalee Curry, MD.  Ples Specter, CMA  I have completed the exam and reviewed the above documentation for accuracy and completeness.  I agree with the above.  Museum/gallery conservator has been used and any errors in dictation or transcription are unintentional.  Donnalee Curry, M.D., F.A.C.S.  The patient is scheduled for a Colonoscopy at [facility] on 06/29/17. They are aware to call the day before to get their arrival time. Miralax prescription has been sent into the patient's pharmacy. The patient is aware of date and instructions.  Documented by Sinda Du LPN     Donnalee Curry  W 04/27/2017, 7:04 PM

## 2017-04-26 NOTE — Patient Instructions (Addendum)
Colonoscopy, Adult A colonoscopy is an exam to look at the entire large intestine. During the exam, a lubricated, bendable tube is inserted into the anus and then passed into the rectum, colon, and other parts of the large intestine. A colonoscopy is often done as a part of normal colorectal screening or in response to certain symptoms, such as anemia, persistent diarrhea, abdominal pain, and blood in the stool. The exam can help screen for and diagnose medical problems, including:  Tumors.  Polyps.  Inflammation.  Areas of bleeding.  Tell a health care provider about:  Any allergies you have.  All medicines you are taking, including vitamins, herbs, eye drops, creams, and over-the-counter medicines.  Any problems you or family members have had with anesthetic medicines.  Any blood disorders you have.  Any surgeries you have had.  Any medical conditions you have.  Any problems you have had passing stool. What are the risks? Generally, this is a safe procedure. However, problems may occur, including:  Bleeding.  A tear in the intestine.  A reaction to medicines given during the exam.  Infection (rare).  What happens before the procedure? Eating and drinking restrictions Follow instructions from your health care provider about eating and drinking, which may include:  A few days before the procedure - follow a low-fiber diet. Avoid nuts, seeds, dried fruit, raw fruits, and vegetables.  1-3 days before the procedure - follow a clear liquid diet. Drink only clear liquids, such as clear broth or bouillon, black coffee or tea, clear juice, clear soft drinks or sports drinks, gelatin dessert, and popsicles. Avoid any liquids that contain red or purple dye.  On the day of the procedure - do not eat or drink anything during the 2 hours before the procedure, or within the time period that your health care provider recommends.  Bowel prep If you were prescribed an oral bowel prep  to clean out your colon:  Take it as told by your health care provider. Starting the day before your procedure, you will need to drink a large amount of medicated liquid. The liquid will cause you to have multiple loose stools until your stool is almost clear or light green.  If your skin or anus gets irritated from diarrhea, you may use these to relieve the irritation: ? Medicated wipes, such as adult wet wipes with aloe and vitamin E. ? A skin soothing-product like petroleum jelly.  If you vomit while drinking the bowel prep, take a break for up to 60 minutes and then begin the bowel prep again. If vomiting continues and you cannot take the bowel prep without vomiting, call your health care provider.  General instructions  Ask your health care provider about changing or stopping your regular medicines. This is especially important if you are taking diabetes medicines or blood thinners.  Plan to have someone take you home from the hospital or clinic. What happens during the procedure?  An IV tube may be inserted into one of your veins.  You will be given medicine to help you relax (sedative).  To reduce your risk of infection: ? Your health care team will wash or sanitize their hands. ? Your anal area will be washed with soap.  You will be asked to lie on your side with your knees bent.  Your health care provider will lubricate a long, thin, flexible tube. The tube will have a camera and a light on the end.  The tube will be inserted into your   anus.  The tube will be gently eased through your rectum and colon.  Air will be delivered into your colon to keep it open. You may feel some pressure or cramping.  The camera will be used to take images during the procedure.  A small tissue sample may be removed from your body to be examined under a microscope (biopsy). If any potential problems are found, the tissue will be sent to a lab for testing.  If small polyps are found, your  health care provider may remove them and have them checked for cancer cells.  The tube that was inserted into your anus will be slowly removed. The procedure may vary among health care providers and hospitals. What happens after the procedure?  Your blood pressure, heart rate, breathing rate, and blood oxygen level will be monitored until the medicines you were given have worn off.  Do not drive for 24 hours after the exam.  You may have a small amount of blood in your stool.  You may pass gas and have mild abdominal cramping or bloating due to the air that was used to inflate your colon during the exam.  It is up to you to get the results of your procedure. Ask your health care provider, or the department performing the procedure, when your results will be ready. This information is not intended to replace advice given to you by your health care provider. Make sure you discuss any questions you have with your health care provider. Document Released: 06/04/2000 Document Revised: 04/07/2016 Document Reviewed: 08/19/2015 Elsevier Interactive Patient Education  [employer]2018 [employer]  The patient is [facility]. They are aware to call the day before to get their arrival time. Miralax prescription has been sent into the patient's pharmacy. The patient is aware of date and instructions.

## 2017-04-27 DIAGNOSIS — Z1211 Encounter for screening for malignant neoplasm of colon: Secondary | ICD-10-CM | POA: Insufficient documentation

## 2017-06-22 ENCOUNTER — Telehealth: Payer: Self-pay | Admitting: *Deleted

## 2017-06-22 NOTE — Telephone Encounter (Signed)
Message left for patient to call the office.   Patient is scheduled for a colonoscopy with Dr. Lemar LivingsByrnett on [facility].   We need to confirm he has had no medication or health changes since his last office visit.   Also, need to make sure he has picked up his Miralax prescription.

## 2017-06-28 NOTE — Telephone Encounter (Signed)
Patient reports no new medications. He is aware of instructions for prep.

## 2017-06-29 ENCOUNTER — Ambulatory Visit
Admission: RE | Admit: 2017-06-29 | Discharge: 2017-06-29 | Disposition: A | Discharge status: Home / Self Care | Payer: 59 | Source: Ambulatory Visit | Attending: General Surgery | Admitting: General Surgery

## 2017-06-29 ENCOUNTER — Ambulatory Visit: Payer: 59 | Admitting: Anesthesiology

## 2017-06-29 ENCOUNTER — Encounter
Admission: RE | Disposition: A | Discharge status: Home / Self Care | Payer: Self-pay | Source: Ambulatory Visit | Attending: General Surgery

## 2017-06-29 DIAGNOSIS — Z79899 Other long term (current) drug therapy: Secondary | ICD-10-CM | POA: Insufficient documentation

## 2017-06-29 DIAGNOSIS — Z8619 Personal history of other infectious and parasitic diseases: Secondary | ICD-10-CM | POA: Diagnosis not present

## 2017-06-29 DIAGNOSIS — Z1211 Encounter for screening for malignant neoplasm of colon: Secondary | ICD-10-CM | POA: Diagnosis not present

## 2017-06-29 HISTORY — DX: Zoster without complications: B02.9

## 2017-06-29 HISTORY — PX: COLONOSCOPY WITH PROPOFOL: SHX5780

## 2017-06-29 SURGERY — COLONOSCOPY WITH PROPOFOL
Anesthesia: General

## 2017-06-29 MED ORDER — SODIUM CHLORIDE 0.9 % IV SOLN
INTRAVENOUS | Status: DC | PRN
  Administered 2017-06-29: 08:00:00 via INTRAVENOUS

## 2017-06-29 MED ORDER — PROPOFOL 500 MG/50ML IV EMUL
INTRAVENOUS | Status: DC | PRN
  Administered 2017-06-29: 125 ug/kg/min via INTRAVENOUS

## 2017-06-29 MED ORDER — LIDOCAINE HCL (CARDIAC) 20 MG/ML IV SOLN
INTRAVENOUS | Status: DC | PRN
  Administered 2017-06-29: 50 mg via INTRAVENOUS

## 2017-06-29 MED ORDER — PROPOFOL 500 MG/50ML IV EMUL
INTRAVENOUS | Status: AC
  Filled 2017-06-29: qty 50

## 2017-06-29 MED ORDER — PROPOFOL 10 MG/ML IV BOLUS
INTRAVENOUS | Status: DC | PRN
  Administered 2017-06-29 (×2): 20 mg via INTRAVENOUS
  Administered 2017-06-29: 50 mg via INTRAVENOUS

## 2017-06-29 MED ORDER — SODIUM CHLORIDE 0.9 % IV SOLN
INTRAVENOUS | Status: DC
  Administered 2017-06-29: 08:00:00 via INTRAVENOUS

## 2017-06-29 NOTE — Transfer of Care (Signed)
Immediate Anesthesia Transfer of Care Note  Patient: Jeffery SkiffJohn D Mcmillan  Procedure(s) Performed: COLONOSCOPY WITH PROPOFOL (N/A )  Patient Location: Endoscopy Unit  Anesthesia Type:General  Level of Consciousness: sedated  Airway & Oxygen Therapy: Patient Spontanous Breathing and Patient connected to nasal cannula oxygen  Post-op Assessment: Report given to RN and Post -op Vital signs reviewed and stable  Post vital signs: Reviewed and stable  Last Vitals:  Vitals:   06/29/17 0741  BP: (!) 142/86  Pulse: 86  Resp: (!) 22  Temp: (!) 36.1 C  SpO2: 100%    Last Pain:  Vitals:   06/29/17 0741  TempSrc: Tympanic         Complications: No apparent anesthesia complications

## 2017-06-29 NOTE — Anesthesia Post-op Follow-up Note (Signed)
Anesthesia QCDR form completed.        

## 2017-06-29 NOTE — Anesthesia Preprocedure Evaluation (Signed)
Anesthesia Evaluation  Patient identified by MRN, date of birth, ID band Patient awake    Reviewed: Allergy & Precautions, NPO status , Patient's Chart, lab work & pertinent test results  History of Anesthesia Complications Negative for: history of anesthetic complications  Airway Mallampati: II  TM Distance: >3 FB Neck ROM: Full    Dental no notable dental hx.    Pulmonary neg pulmonary ROS, neg sleep apnea, neg COPD,    breath sounds clear to auscultation- rhonchi (-) wheezing      Cardiovascular Exercise Tolerance: Good (-) hypertension(-) CAD and (-) Past MI  Rhythm:Regular Rate:Normal - Systolic murmurs and - Diastolic murmurs    Neuro/Psych negative neurological ROS  negative psych ROS   GI/Hepatic negative GI ROS, Neg liver ROS,   Endo/Other  negative endocrine ROSneg diabetes  Renal/GU negative Renal ROS     Musculoskeletal negative musculoskeletal ROS (+)   Abdominal (+) - obese,   Peds  Hematology negative hematology ROS (+)   Anesthesia Other Findings Past Medical History: No date: History of chicken pox No date: Shingles   Reproductive/Obstetrics                             Anesthesia Physical Anesthesia Plan  ASA: I  Anesthesia Plan: General   Post-op Pain Management:    Induction: Intravenous  PONV Risk Score and Plan: 1 and Propofol infusion  Airway Management Planned: Natural Airway  Additional Equipment:   Intra-op Plan:   Post-operative Plan:   Informed Consent: I have reviewed the patients History and Physical, chart, labs and discussed the procedure including the risks, benefits and alternatives for the proposed anesthesia with the patient or authorized representative who has indicated his/her understanding and acceptance.   Dental advisory given  Plan Discussed with: CRNA and Anesthesiologist  Anesthesia Plan Comments:         Anesthesia  Quick Evaluation

## 2017-06-29 NOTE — Anesthesia Postprocedure Evaluation (Signed)
Anesthesia Post Note  Patient: Jeffery SkiffJohn D Tolley  Procedure(s) Performed: COLONOSCOPY WITH PROPOFOL (N/A )  Patient location during evaluation: Endoscopy Anesthesia Type: General Level of consciousness: awake and alert and oriented Pain management: pain level controlled Vital Signs Assessment: post-procedure vital signs reviewed and stable Respiratory status: spontaneous breathing, nonlabored ventilation and respiratory function stable Cardiovascular status: blood pressure returned to baseline and stable Postop Assessment: no signs of nausea or vomiting Anesthetic complications: no     Last Vitals:  Vitals:   06/29/17 0847 06/29/17 0854  BP: (!) 146/81 107/66  Pulse: 87 78  Resp: 17 14  Temp:    SpO2: 100% 100%    Last Pain:  Vitals:   06/29/17 0854  TempSrc:   PainSc: 0-No pain                 Allora Bains

## 2017-06-29 NOTE — Op Note (Signed)
[facility] Patient Name: Jeffery Mcmillan Procedure Date: 06/29/2017 8:07 AM MRN: [id number] Account #: [id number] Date of Birth: 1966-04-27 Admit Type: Outpatient Age: 52 Room: [facility] ENDO ROOM 1 Gender: Male Note Status: Finalized Procedure:            Colonoscopy Indications:          Screening for colorectal malignant neoplasm Providers:            Earline Mayotte, MD Referring MD:         Duncan Dull, MD (Referring MD) Medicines:            Monitored Anesthesia Care Complications:        No immediate complications. Procedure:            Pre-Anesthesia Assessment:                       - Prior to the procedure, a History and Physical was                        performed, and patient medications, allergies and                        sensitivities were reviewed. The patient's tolerance of                        previous anesthesia was reviewed.                       - The risks and benefits of the procedure and the                        sedation options and risks were discussed with the                        patient. All questions were answered and informed                        consent was obtained.                       After obtaining informed consent, the colonoscope was                        passed under direct vision. Throughout the procedure,                        the patient's blood pressure, pulse, and oxygen                        saturations were monitored continuously. The                        Colonoscope was introduced through the anus and                        advanced to the the cecum, identified by appendiceal                        orifice and ileocecal valve. The colonoscopy was  performed without difficulty. The patient tolerated the                        procedure well. The quality of the bowel preparation                        was excellent. Findings:      The entire examined colon appeared  normal on direct and retroflexion       views. Impression:           - The entire examined colon is normal on direct and                        retroflexion views.                       - No specimens collected. Recommendation:       - Repeat colonoscopy in 10 years for screening purposes. Procedure Code(s):    --- Professional ---                       [phone] [id number], Colonoscopy, flexible; diagnostic, including                        collection of specimen(s) by brushing or washing, when                        performed (separate procedure) Diagnosis Code(s):    --- Professional ---                       Z12.11, Encounter for screening for malignant neoplasm                        of colon CPT copyright 2016 American Medical Association. All rights reserved. The codes documented in this report are preliminary and upon coder review may  be revised to meet current compliance requirements. Earline MayotteJeffrey W. Mckynna Vanloan, MD 06/29/2017 8:32:28 AM This report has been signed electronically. Number of Addenda: 0 Note Initiated On: 06/29/2017 8:07 AM Scope Withdrawal Time: 0 hours 8 minutes 21 seconds  Total Procedure Duration: 0 hours 13 minutes 55 seconds       [facility]

## 2017-06-29 NOTE — H&P (Signed)
Marjie SkiffJohn D Marron [id number] 11/16/1965     HPI:  52 y/o male for screening colonoscopy. Tolerated prep well.   Medications Prior to Admission  Medication Sig Dispense Refill Last Dose  . celecoxib (CELEBREX) 200 MG capsule Take 1 capsule (200 mg total) by mouth 2 (two) times daily. (Patient not taking: Reported on 06/29/2017) 60 capsule 2 Not Taking at Unknown time  . cyclobenzaprine (FLEXERIL) 10 MG tablet Take 1 tablet (10 mg total) by mouth 3 (three) times daily as needed for muscle spasms. (Patient not taking: Reported on 06/29/2017) 90 tablet 0 Not Taking at Unknown time   No Known Allergies Past Medical History:  Diagnosis Date  . History of chicken pox   . Shingles    Past Surgical History:  Procedure Laterality Date  . HERNIA REPAIR    . KNEE ARTHROSCOPY Right   . SHOULDER ARTHROSCOPY Right    Social History   Socioeconomic History  . Marital status: Divorced    Spouse name: Not on file  . Number of children: Not on file  . Years of education: 3116  . Highest education level: Not on file  Social Needs  . Financial resource strain: Not on file  . Food insecurity - worry: Not on file  . Food insecurity - inability: Not on file  . Transportation needs - medical: Not on file  . Transportation needs - non-medical: Not on file  Occupational History  . Occupation: Civil engineer, contractingolice Officer    Employer: UNEMPLOYED  Tobacco Use  . Smoking status: Never Smoker  . Smokeless tobacco: Never Used  Substance and Sexual Activity  . Alcohol use: No  . Drug use: No  . Sexual activity: Not on file  Other Topics Concern  . Not on file  Social History Narrative   ** Merged History Encounter **       Regular exercise-yes   Caffeine Use-yes   Social History   Social History Narrative   ** Merged History Encounter **       Regular exercise-yes   Caffeine Use-yes     ROS: Negative.     PE: HEENT: Negative. Lungs: Clear. Cardio: RR. Assessment/Plan:  Proceed with planned  endoscopy.  Earline MayotteByrnett, Jeffrey W 06/29/2017

## 2017-06-30 ENCOUNTER — Encounter: Payer: Self-pay | Admitting: General Surgery
